# Patient Record
Sex: Female | Born: 1960 | Race: Black or African American | Hispanic: No | Marital: Single | Smoking: Current every day smoker
Health system: Southern US, Community
[De-identification: ages and names within clinical notes are randomized; demographics above are authoritative.]

## PROBLEM LIST (undated history)

## (undated) DIAGNOSIS — I10 Essential (primary) hypertension: Secondary | ICD-10-CM

## (undated) DIAGNOSIS — K861 Other chronic pancreatitis: Secondary | ICD-10-CM

## (undated) DIAGNOSIS — D62 Acute posthemorrhagic anemia: Secondary | ICD-10-CM

## (undated) DIAGNOSIS — F1010 Alcohol abuse, uncomplicated: Secondary | ICD-10-CM

## (undated) DIAGNOSIS — J449 Chronic obstructive pulmonary disease, unspecified: Secondary | ICD-10-CM

## (undated) DIAGNOSIS — K469 Unspecified abdominal hernia without obstruction or gangrene: Secondary | ICD-10-CM

## (undated) DIAGNOSIS — D689 Coagulation defect, unspecified: Secondary | ICD-10-CM

## (undated) DIAGNOSIS — K7031 Alcoholic cirrhosis of liver with ascites: Secondary | ICD-10-CM

## (undated) DIAGNOSIS — D638 Anemia in other chronic diseases classified elsewhere: Secondary | ICD-10-CM

## (undated) DIAGNOSIS — D696 Thrombocytopenia, unspecified: Secondary | ICD-10-CM

## (undated) DIAGNOSIS — I509 Heart failure, unspecified: Secondary | ICD-10-CM

## (undated) DIAGNOSIS — K922 Gastrointestinal hemorrhage, unspecified: Secondary | ICD-10-CM

## (undated) DIAGNOSIS — Z720 Tobacco use: Secondary | ICD-10-CM

## (undated) HISTORY — PX: PARACENTESIS: SHX844

---

## 2013-10-30 ENCOUNTER — Inpatient Hospital Stay (HOSPITAL_BASED_OUTPATIENT_CLINIC_OR_DEPARTMENT_OTHER): Payer: Medicaid Other

## 2013-10-30 ENCOUNTER — Emergency Department (HOSPITAL_BASED_OUTPATIENT_CLINIC_OR_DEPARTMENT_OTHER): Payer: Medicaid Other

## 2013-10-30 ENCOUNTER — Inpatient Hospital Stay (HOSPITAL_BASED_OUTPATIENT_CLINIC_OR_DEPARTMENT_OTHER)
Admission: EM | Admit: 2013-10-30 | Discharge: 2013-11-02 | DRG: 189 | Disposition: A | Discharge status: Home Health | Payer: Medicaid Other | Attending: Internal Medicine | Admitting: Internal Medicine

## 2013-10-30 ENCOUNTER — Encounter (HOSPITAL_BASED_OUTPATIENT_CLINIC_OR_DEPARTMENT_OTHER): Payer: Self-pay | Admitting: Emergency Medicine

## 2013-10-30 DIAGNOSIS — I10 Essential (primary) hypertension: Secondary | ICD-10-CM | POA: Diagnosis present

## 2013-10-30 DIAGNOSIS — Z833 Family history of diabetes mellitus: Secondary | ICD-10-CM | POA: Diagnosis not present

## 2013-10-30 DIAGNOSIS — I509 Heart failure, unspecified: Secondary | ICD-10-CM | POA: Diagnosis present

## 2013-10-30 DIAGNOSIS — J9621 Acute and chronic respiratory failure with hypoxia: Secondary | ICD-10-CM | POA: Diagnosis present

## 2013-10-30 DIAGNOSIS — J441 Chronic obstructive pulmonary disease with (acute) exacerbation: Secondary | ICD-10-CM | POA: Diagnosis present

## 2013-10-30 DIAGNOSIS — D649 Anemia, unspecified: Secondary | ICD-10-CM | POA: Diagnosis present

## 2013-10-30 DIAGNOSIS — D6959 Other secondary thrombocytopenia: Secondary | ICD-10-CM | POA: Diagnosis present

## 2013-10-30 DIAGNOSIS — F329 Major depressive disorder, single episode, unspecified: Secondary | ICD-10-CM | POA: Diagnosis present

## 2013-10-30 DIAGNOSIS — F3289 Other specified depressive episodes: Secondary | ICD-10-CM | POA: Diagnosis present

## 2013-10-30 DIAGNOSIS — R062 Wheezing: Secondary | ICD-10-CM | POA: Diagnosis present

## 2013-10-30 DIAGNOSIS — Z720 Tobacco use: Secondary | ICD-10-CM

## 2013-10-30 DIAGNOSIS — I5032 Chronic diastolic (congestive) heart failure: Secondary | ICD-10-CM | POA: Diagnosis present

## 2013-10-30 DIAGNOSIS — F17200 Nicotine dependence, unspecified, uncomplicated: Secondary | ICD-10-CM | POA: Diagnosis present

## 2013-10-30 DIAGNOSIS — T380X5A Adverse effect of glucocorticoids and synthetic analogues, initial encounter: Secondary | ICD-10-CM | POA: Diagnosis present

## 2013-10-30 DIAGNOSIS — D696 Thrombocytopenia, unspecified: Secondary | ICD-10-CM

## 2013-10-30 DIAGNOSIS — R188 Other ascites: Secondary | ICD-10-CM | POA: Diagnosis present

## 2013-10-30 DIAGNOSIS — J9620 Acute and chronic respiratory failure, unspecified whether with hypoxia or hypercapnia: Principal | ICD-10-CM | POA: Diagnosis present

## 2013-10-30 DIAGNOSIS — R0602 Shortness of breath: Secondary | ICD-10-CM | POA: Diagnosis present

## 2013-10-30 DIAGNOSIS — K7030 Alcoholic cirrhosis of liver without ascites: Secondary | ICD-10-CM | POA: Diagnosis present

## 2013-10-30 DIAGNOSIS — R7309 Other abnormal glucose: Secondary | ICD-10-CM | POA: Diagnosis present

## 2013-10-30 DIAGNOSIS — K7460 Unspecified cirrhosis of liver: Secondary | ICD-10-CM | POA: Diagnosis present

## 2013-10-30 DIAGNOSIS — F1010 Alcohol abuse, uncomplicated: Secondary | ICD-10-CM | POA: Diagnosis present

## 2013-10-30 HISTORY — DX: Chronic obstructive pulmonary disease, unspecified: J44.9

## 2013-10-30 HISTORY — DX: Heart failure, unspecified: I50.9

## 2013-10-30 HISTORY — DX: Essential (primary) hypertension: I10

## 2013-10-30 HISTORY — DX: Tobacco use: Z72.0

## 2013-10-30 HISTORY — DX: Alcohol abuse, uncomplicated: F10.10

## 2013-10-30 LAB — CBC WITH DIFFERENTIAL/PLATELET
BASOS PCT: 0 % (ref 0–1)
Basophils Absolute: 0 10*3/uL (ref 0.0–0.1)
EOS PCT: 0 % (ref 0–5)
Eosinophils Absolute: 0 10*3/uL (ref 0.0–0.7)
HCT: 29.7 % — ABNORMAL LOW (ref 36.0–46.0)
HEMOGLOBIN: 10.3 g/dL — AB (ref 12.0–15.0)
LYMPHS ABS: 0.9 10*3/uL (ref 0.7–4.0)
Lymphocytes Relative: 5 % — ABNORMAL LOW (ref 12–46)
MCH: 29.7 pg (ref 26.0–34.0)
MCHC: 34.7 g/dL (ref 30.0–36.0)
MCV: 85.6 fL (ref 78.0–100.0)
MONOS PCT: 5 % (ref 3–12)
Monocytes Absolute: 1 10*3/uL (ref 0.1–1.0)
NEUTROS PCT: 90 % — AB (ref 43–77)
Neutro Abs: 17.8 10*3/uL — ABNORMAL HIGH (ref 1.7–7.7)
PLATELETS: 87 10*3/uL — AB (ref 150–400)
RBC: 3.47 MIL/uL — AB (ref 3.87–5.11)
RDW: 15.5 % (ref 11.5–15.5)
WBC: 19.7 10*3/uL — ABNORMAL HIGH (ref 4.0–10.5)

## 2013-10-30 LAB — COMPREHENSIVE METABOLIC PANEL
ALBUMIN: 3.3 g/dL — AB (ref 3.5–5.2)
ALT: 18 U/L (ref 0–35)
ANION GAP: 13 (ref 5–15)
AST: 28 U/L (ref 0–37)
Alkaline Phosphatase: 219 U/L — ABNORMAL HIGH (ref 39–117)
BUN: 10 mg/dL (ref 6–23)
CALCIUM: 9.1 mg/dL (ref 8.4–10.5)
CO2: 32 mEq/L (ref 19–32)
CREATININE: 0.8 mg/dL (ref 0.50–1.10)
Chloride: 94 mEq/L — ABNORMAL LOW (ref 96–112)
GFR calc non Af Amer: 83 mL/min — ABNORMAL LOW (ref >90)
GLUCOSE: 228 mg/dL — AB (ref 70–99)
Potassium: 4.7 mEq/L (ref 3.7–5.3)
Sodium: 139 mEq/L (ref 137–147)
TOTAL PROTEIN: 6.8 g/dL (ref 6.0–8.3)
Total Bilirubin: 0.6 mg/dL (ref 0.3–1.2)

## 2013-10-30 LAB — I-STAT ARTERIAL BLOOD GAS, ED
ACID-BASE EXCESS: 11 mmol/L — AB (ref 0.0–2.0)
Bicarbonate: 37.1 mEq/L — ABNORMAL HIGH (ref 20.0–24.0)
O2 SAT: 98 %
TCO2: 39 mmol/L (ref 0–100)
pCO2 arterial: 56.7 mmHg — ABNORMAL HIGH (ref 35.0–45.0)
pH, Arterial: 7.424 (ref 7.350–7.450)
pO2, Arterial: 99 mmHg (ref 80.0–100.0)

## 2013-10-30 LAB — PRO B NATRIURETIC PEPTIDE: PRO B NATRI PEPTIDE: 630.4 pg/mL — AB (ref 0–125)

## 2013-10-30 LAB — I-STAT CG4 LACTIC ACID, ED: Lactic Acid, Venous: 2.25 mmol/L — ABNORMAL HIGH (ref 0.5–2.2)

## 2013-10-30 LAB — PROTIME-INR
INR: 1.21 (ref 0.00–1.49)
PROTHROMBIN TIME: 15.3 s — AB (ref 11.6–15.2)

## 2013-10-30 LAB — TROPONIN I

## 2013-10-30 MED ORDER — ALBUTEROL SULFATE (2.5 MG/3ML) 0.083% IN NEBU
INHALATION_SOLUTION | RESPIRATORY_TRACT | Status: AC
  Administered 2013-10-30: 5 mg
  Filled 2013-10-30: qty 6

## 2013-10-30 MED ORDER — LEVOFLOXACIN IN D5W 500 MG/100ML IV SOLN
500.0000 mg | Freq: Once | INTRAVENOUS | Status: AC
  Administered 2013-10-30: 500 mg via INTRAVENOUS
  Filled 2013-10-30: qty 100

## 2013-10-30 MED ORDER — IPRATROPIUM BROMIDE 0.02 % IN SOLN
RESPIRATORY_TRACT | Status: AC
  Administered 2013-10-30: 0.5 mg
  Filled 2013-10-30: qty 2.5

## 2013-10-30 MED ORDER — ALBUTEROL (5 MG/ML) CONTINUOUS INHALATION SOLN: 10.0000 mg/h | INHALATION_SOLUTION | Freq: Once | RESPIRATORY_TRACT | Status: DC

## 2013-10-30 MED ORDER — METHYLPREDNISOLONE SODIUM SUCC 125 MG IJ SOLR
125.0000 mg | Freq: Once | INTRAMUSCULAR | Status: AC
  Administered 2013-10-30: 125 mg via INTRAVENOUS
  Filled 2013-10-30: qty 2

## 2013-10-30 MED ORDER — ALBUTEROL SULFATE (2.5 MG/3ML) 0.083% IN NEBU: 2.5000 mg | INHALATION_SOLUTION | RESPIRATORY_TRACT | Status: DC | PRN

## 2013-10-30 NOTE — ED Notes (Signed)
Family contact info: Alphonzo DublinValarie Mitchell sister [phone],  Noreene Larssonnnie Sunday mother [phone].

## 2013-10-30 NOTE — ED Provider Notes (Signed)
CSN: [id number]     Arrival date & time 10/30/13  1856 History  This chart was scribed for Glynn Octave, MD by Modena Jansky, ED Scribe. This patient was seen in room MH09/MH09 and the patient's care was started at 7:14 PM.  Chief Complaint  Patient presents with  . Shortness of Breath   HPI HPI Comments: Marie Brooks is a 53 y.o. female with a hx of COPD and CHF who presents to the Emergency Department complaining of intermittent moderate SOB that started two months ago. She was diagnosed with pneumonia at another hospital in [facility] and left AMA because "they weren't doing anything". She has been admitted to [facility] in May and June as well for COPD and possible PNA. She reports a nonproductive cough and she feels that there is mucous in her lungs. She reports associated abdominal pain. She also reports a subjective fever. She states that she quit smoking 3 days ago. She reports that she uses a nebulizer treatment at home. She denies any hx of blood clots, MI, or stents. She also denies any emesis or chest pain.    Past Medical History  Diagnosis Date  . COPD (chronic obstructive pulmonary disease)   . CHF (congestive heart failure)   . HTN (hypertension)   . Ascites   . Cirrhosis   . Tobacco abuse   . Alcohol abuse    Past Surgical History  Procedure Laterality Date  . Paracentesis N/A     Early July 2015.   Family History  Problem Relation Age of Onset  . Diabetes Brother    History  Substance Use Topics  . Smoking status: Current Every Day Smoker -- 0.25 packs/day for 30 years  . Smokeless tobacco: Not on file  . Alcohol Use: Yes   OB History   Grav Para Term Preterm Abortions TAB SAB Ect Mult Living                 Review of Systems  A complete 10 system review of systems was obtained and all systems are negative except as noted in the HPI and PMH.   Allergies  Review of patient's allergies indicates no known allergies.  Home Medications   Prior to Admission  medications   Medication Sig Start Date End Date Taking? Authorizing Provider  albuterol (PROVENTIL) (2.5 MG/3ML) 0.083% nebulizer solution Take 2.5 mg by nebulization every 6 (six) hours as needed for wheezing or shortness of breath.   Yes Historical Provider, MD  carvedilol (COREG) 3.125 MG tablet Take 3.125 mg by mouth 2 (two) times daily with a meal.   Yes Historical Provider, MD  Fluticasone-Salmeterol (ADVAIR) 250-50 MCG/DOSE AEPB Inhale 1 puff into the lungs 2 (two) times daily.   Yes Historical Provider, MD  furosemide (LASIX) 20 MG tablet Take 20 mg by mouth daily.   Yes Historical Provider, MD  lisinopril (PRINIVIL,ZESTRIL) 10 MG tablet Take 10 mg by mouth daily.   Yes Historical Provider, MD  pantoprazole (PROTONIX) 40 MG tablet Take 40 mg by mouth daily.   Yes Historical Provider, MD  potassium chloride SA (K-DUR,KLOR-CON) 20 MEQ tablet Take 20 mEq by mouth daily.   Yes Historical Provider, MD  spironolactone (ALDACTONE) 100 MG tablet Take 100 mg by mouth daily.   Yes Historical Provider, MD  tiotropium (SPIRIVA) 18 MCG inhalation capsule Place 18 mcg into inhaler and inhale daily.   Yes Historical Provider, MD   BP 148/85  Pulse 108  Temp(Src) 98.7 F (37.1 C) (Oral)  Resp 24  Ht 5\' 7"  (1.702 m)  Wt 153 lb 11.2 oz (69.718 kg)  BMI 24.07 kg/m2  SpO2 93% Physical Exam  Nursing note and vitals reviewed. Constitutional: She is oriented to person, place, and time. She appears well-developed and well-nourished. She appears distressed.  HENT:  Head: Normocephalic and atraumatic.  Mouth/Throat: Oropharynx is clear and moist.  Eyes: Conjunctivae and EOM are normal. Pupils are equal, round, and reactive to light.  Neck: Normal range of motion. Neck supple.  Cardiovascular: Normal rate, regular rhythm and normal heart sounds.   No murmur heard. Pulmonary/Chest: She is in respiratory distress. She has wheezes.  Moderate respiratory distress. Speaking in short sentences. Decreased  breath sounds throughout with scattered expiratory wheezing. Poor air movement  Abdominal: Soft. There is no tenderness. There is no rebound and no guarding.  Questionable ascites on abdomen, bandage to recent paracentesis site.  Musculoskeletal: Normal range of motion. She exhibits edema. She exhibits no tenderness.  Trace lower extremity edema.  Neurological: She is alert and oriented to person, place, and time. No cranial nerve deficit. She exhibits normal muscle tone. Coordination normal.  Skin: Skin is warm. No rash noted.    ED Course  Procedures (including critical care time) DIAGNOSTIC STUDIES: Oxygen Saturation is 93% on oxygen, normal by my interpretation.    COORDINATION OF CARE: 7:18 PM- Pt advised of plan for treatment which includes medication, radiology, and labsand pt agrees.  Labs Review Labs Reviewed  CBC WITH DIFFERENTIAL - Abnormal; Notable for the following:    WBC 19.7 (*)    RBC 3.47 (*)    Hemoglobin 10.3 (*)    HCT 29.7 (*)    Platelets 87 (*)    Neutrophils Relative % 90 (*)    Neutro Abs 17.8 (*)    Lymphocytes Relative 5 (*)    All other components within normal limits  COMPREHENSIVE METABOLIC PANEL - Abnormal; Notable for the following:    Chloride 94 (*)    Glucose, Bld 228 (*)    Albumin 3.3 (*)    Alkaline Phosphatase 219 (*)    GFR calc non Af Amer 83 (*)    All other components within normal limits  PRO B NATRIURETIC PEPTIDE - Abnormal; Notable for the following:    Pro B Natriuretic peptide (BNP) 630.4 (*)    All other components within normal limits  PROTIME-INR - Abnormal; Notable for the following:    Prothrombin Time 15.3 (*)    All other components within normal limits  I-STAT ARTERIAL BLOOD GAS, ED - Abnormal; Notable for the following:    pCO2 arterial 56.7 (*)    Bicarbonate 37.1 (*)    Acid-Base Excess 11.0 (*)    All other components within normal limits  I-STAT CG4 LACTIC ACID, ED - Abnormal; Notable for the following:     Lactic Acid, Venous 2.25 (*)    All other components within normal limits  MRSA PCR SCREENING  TROPONIN I  URINALYSIS, ROUTINE W REFLEX MICROSCOPIC  BASIC METABOLIC PANEL  CBC  HEMOGLOBIN A1C  TSH    Imaging Review Dg Chest Portable 1 View  10/30/2013   CLINICAL DATA:  Shortness of breath.  History of CHF.  EXAM: PORTABLE CHEST - 1 VIEW  COMPARISON:  CT chest 10/29/2013 and chest radiograph 10/29/2013  FINDINGS: Heart mediastinal, and hilar contours are stable. There are emphysematous changes of the lungs. Slight blunting of the right costophrenic angle may reflect a small right pleural effusion. No airspace  disease is identified.  IMPRESSION: 1.  Emphysema/COPD.  2.  Cannot exclude small right pleural effusion.   Electronically Signed   By: Britta Mccreedy M.D.   On: 10/30/2013 20:01     EKG Interpretation   Date/Time:  Sunday October 30 2013 19:18:41 EDT Ventricular Rate:  106 PR Interval:  122 QRS Duration: 74 QT Interval:  338 QTC Calculation: 448 R Axis:   67 Text Interpretation:  Sinus tachycardia Low voltage QRS Borderline ECG No  previous ECGs available Confirmed by Manus Gunning  MD, Gomer France [phone]) on  10/30/2013 7:35:14 PM      MDM   Final diagnoses:  Acute on chronic respiratory failure with hypoxia   Patient left AMA from [facility] earlier today after being admitted for COPD and pneumonia. She is in respiratory distress with decreased air movement throughout. She denies any chest pain. Hypoxia to 90 on room air.  CT report from July 11 shows clear lungs with small right-sided effusion and anasarca of the soft tissues. Chest x-ray from July 11 shows COPD changes with bibasilar airspace opacities.  Patient appears to have COPD exacerbation possible diastolic heart failure exacerbation. No CO2 retention ABG. Chest x-ray is clear. Patient given nebulizers, steroids, IV Levaquin.  Ultrasound shows small pericardial effusion. No evidence of tamponade or R  heart strain  Records requested from [facility]. Patient is stable nasal cannula and does not require BiPAP at this time. She'll need admission to the hospital. No step down beds available at [facility]. Discussed with Dr. Betti Cruz. Dr. Waymon Amato accepts patient to [facility] long stepdown. CTPE study was ordered but not completed prior to transfer. [facility] from [facility] not received prior to transfer.  CRITICAL CARE Performed by: Glynn Octave Total critical care time: 30 Critical care time was exclusive of separately billable procedures and treating other patients. Critical care was necessary to treat or prevent imminent or life-threatening deterioration. Critical care was time spent personally by me on the following activities: development of treatment plan with patient and/or surrogate as well as nursing, discussions with consultants, evaluation of patient's response to treatment, examination of patient, obtaining history from patient or surrogate, ordering and performing treatments and interventions, ordering and review of laboratory studies, ordering and review of radiographic studies, pulse oximetry and re-evaluation of patient's condition.     EMERGENCY DEPARTMENT [place] CARDIAC EXAM "Study: Limited Ultrasound of the heart and pericardium"  INDICATIONS:Dyspnea Multiple views of the heart and pericardium are obtained with a multi-frequency probe.  PERFORMED UE:AVWUJW  IMAGES ARCHIVED?: Yes  FINDINGS: Small effusion, Normal contractility and Tamponade physiology absent  LIMITATIONS:  Body habitus and Emergent procedure  VIEWS USED: Subcostal 4 chamber and Parasternal long axis  INTERPRETATION: Cardiac activity present, Pericardial effusion present, Cardiac tamponade absent and Probable elevated CVP  COMMENT:    BP 127/87  Pulse 88  Temp(Src) 98.4 F (36.9 C) (Oral)  Resp 18  Ht 5\' 7"  (1.702 m)  Wt 155 lb 6.8 oz (70.5 kg)  BMI 24.34 kg/m2  SpO2 94%   I personally  performed the services described in this documentation, which was scribed in my presence. The recorded information has been reviewed and is accurate.     Glynn Octave, MD 10/31/13 0157

## 2013-10-30 NOTE — ED Notes (Signed)
Pt reports was dx with pneumonia today.  Shortness of breath.  Speaking in short sentences with some dyspnea.

## 2013-10-31 ENCOUNTER — Inpatient Hospital Stay (HOSPITAL_COMMUNITY): Payer: Medicaid Other

## 2013-10-31 ENCOUNTER — Encounter (HOSPITAL_COMMUNITY): Payer: Self-pay | Admitting: Internal Medicine

## 2013-10-31 DIAGNOSIS — J9620 Acute and chronic respiratory failure, unspecified whether with hypoxia or hypercapnia: Principal | ICD-10-CM

## 2013-10-31 DIAGNOSIS — R188 Other ascites: Secondary | ICD-10-CM | POA: Diagnosis present

## 2013-10-31 DIAGNOSIS — F17200 Nicotine dependence, unspecified, uncomplicated: Secondary | ICD-10-CM

## 2013-10-31 DIAGNOSIS — D696 Thrombocytopenia, unspecified: Secondary | ICD-10-CM

## 2013-10-31 DIAGNOSIS — J441 Chronic obstructive pulmonary disease with (acute) exacerbation: Secondary | ICD-10-CM

## 2013-10-31 DIAGNOSIS — R0902 Hypoxemia: Secondary | ICD-10-CM

## 2013-10-31 DIAGNOSIS — I517 Cardiomegaly: Secondary | ICD-10-CM

## 2013-10-31 DIAGNOSIS — F1010 Alcohol abuse, uncomplicated: Secondary | ICD-10-CM | POA: Diagnosis present

## 2013-10-31 DIAGNOSIS — Z720 Tobacco use: Secondary | ICD-10-CM | POA: Diagnosis present

## 2013-10-31 DIAGNOSIS — K7460 Unspecified cirrhosis of liver: Secondary | ICD-10-CM | POA: Diagnosis present

## 2013-10-31 DIAGNOSIS — J9621 Acute and chronic respiratory failure with hypoxia: Secondary | ICD-10-CM | POA: Diagnosis present

## 2013-10-31 DIAGNOSIS — I10 Essential (primary) hypertension: Secondary | ICD-10-CM | POA: Diagnosis present

## 2013-10-31 HISTORY — DX: Thrombocytopenia, unspecified: D69.6

## 2013-10-31 LAB — GLUCOSE, CAPILLARY
GLUCOSE-CAPILLARY: 226 mg/dL — AB (ref 70–99)
Glucose-Capillary: 188 mg/dL — ABNORMAL HIGH (ref 70–99)
Glucose-Capillary: 149 mg/dL — ABNORMAL HIGH (ref 70–99)

## 2013-10-31 LAB — CBC
HEMATOCRIT: 25.7 % — AB (ref 36.0–46.0)
HEMOGLOBIN: 8.8 g/dL — AB (ref 12.0–15.0)
MCH: 29.8 pg (ref 26.0–34.0)
MCHC: 34.2 g/dL (ref 30.0–36.0)
MCV: 87.1 fL (ref 78.0–100.0)
Platelets: 77 10*3/uL — ABNORMAL LOW (ref 150–400)
RBC: 2.95 MIL/uL — ABNORMAL LOW (ref 3.87–5.11)
RDW: 15.2 % (ref 11.5–15.5)
WBC: 15.6 10*3/uL — AB (ref 4.0–10.5)

## 2013-10-31 LAB — HEMOGLOBIN A1C
Hgb A1c MFr Bld: 6.2 % — ABNORMAL HIGH (ref <5.7)
Mean Plasma Glucose: 131 mg/dL — ABNORMAL HIGH (ref <117)

## 2013-10-31 LAB — BASIC METABOLIC PANEL
ANION GAP: 10 (ref 5–15)
BUN: 12 mg/dL (ref 6–23)
CALCIUM: 8.6 mg/dL (ref 8.4–10.5)
CHLORIDE: 94 meq/L — AB (ref 96–112)
CO2: 33 meq/L — AB (ref 19–32)
Creatinine, Ser: 0.78 mg/dL (ref 0.50–1.10)
GFR calc Af Amer: >90 mL/min (ref >90)
GFR calc non Af Amer: >90 mL/min (ref >90)
Glucose, Bld: 244 mg/dL — ABNORMAL HIGH (ref 70–99)
Potassium: 4.2 mEq/L (ref 3.7–5.3)
SODIUM: 137 meq/L (ref 137–147)

## 2013-10-31 LAB — MRSA PCR SCREENING: MRSA by PCR: NEGATIVE

## 2013-10-31 LAB — URINALYSIS, ROUTINE W REFLEX MICROSCOPIC
BILIRUBIN URINE: NEGATIVE
Glucose, UA: NEGATIVE mg/dL
HGB URINE DIPSTICK: NEGATIVE
KETONES UR: NEGATIVE mg/dL
Leukocytes, UA: NEGATIVE
NITRITE: NEGATIVE
Protein, ur: NEGATIVE mg/dL
Specific Gravity, Urine: 1.01 (ref 1.005–1.030)
UROBILINOGEN UA: 0.2 mg/dL (ref 0.0–1.0)
pH: 6 (ref 5.0–8.0)

## 2013-10-31 LAB — D-DIMER, QUANTITATIVE: D-Dimer, Quant: 3.77 ug/mL-FEU — ABNORMAL HIGH (ref 0.00–0.48)

## 2013-10-31 LAB — TSH: TSH: 0.331 u[IU]/mL — AB (ref 0.350–4.500)

## 2013-10-31 MED ORDER — SPIRONOLACTONE 50 MG PO TABS
50.0000 mg | ORAL_TABLET | Freq: Two times a day (BID) | ORAL | Status: DC
  Administered 2013-10-31 – 2013-11-02 (×4): 50 mg via ORAL
  Filled 2013-10-31 (×2): qty 1
  Filled 2013-10-31: qty 2
  Filled 2013-10-31: qty 1
  Filled 2013-10-31: qty 2
  Filled 2013-10-31: qty 1

## 2013-10-31 MED ORDER — ONDANSETRON 4 MG PO TBDP
4.0000 mg | ORAL_TABLET | Freq: Four times a day (QID) | ORAL | Status: DC | PRN
  Filled 2013-10-31: qty 1

## 2013-10-31 MED ORDER — SODIUM CHLORIDE 0.9 % IJ SOLN
3.0000 mL | Freq: Two times a day (BID) | INTRAMUSCULAR | Status: DC
  Administered 2013-10-31 – 2013-11-01 (×3): 3 mL via INTRAVENOUS

## 2013-10-31 MED ORDER — ENOXAPARIN SODIUM 40 MG/0.4ML ~~LOC~~ SOLN
40.0000 mg | SUBCUTANEOUS | Status: DC
  Administered 2013-10-31 – 2013-11-01 (×2): 40 mg via SUBCUTANEOUS
  Filled 2013-10-31 (×3): qty 0.4

## 2013-10-31 MED ORDER — ACETAMINOPHEN 650 MG RE SUPP
650.0000 mg | Freq: Four times a day (QID) | RECTAL | Status: DC | PRN
Start: 2013-10-31 — End: 2013-11-02

## 2013-10-31 MED ORDER — VITAMIN B-1 100 MG PO TABS
100.0000 mg | ORAL_TABLET | Freq: Every day | ORAL | Status: DC
  Administered 2013-11-01 – 2013-11-02 (×2): 100 mg via ORAL
  Filled 2013-10-31 (×2): qty 1

## 2013-10-31 MED ORDER — TECHNETIUM TO 99M ALBUMIN AGGREGATED
5.5000 | Freq: Once | INTRAVENOUS | Status: AC | PRN
  Administered 2013-10-31: 5.5 via INTRAVENOUS

## 2013-10-31 MED ORDER — HYDROXYZINE HCL 25 MG PO TABS
25.0000 mg | ORAL_TABLET | Freq: Four times a day (QID) | ORAL | Status: DC | PRN
  Filled 2013-10-31: qty 1

## 2013-10-31 MED ORDER — INSULIN ASPART 100 UNIT/ML ~~LOC~~ SOLN
0.0000 [IU] | Freq: Three times a day (TID) | SUBCUTANEOUS | Status: DC
  Administered 2013-10-31: 1 [IU] via SUBCUTANEOUS
  Administered 2013-10-31: 2 [IU] via SUBCUTANEOUS
  Administered 2013-10-31: 3 [IU] via SUBCUTANEOUS
  Administered 2013-11-01: 5 [IU] via SUBCUTANEOUS
  Administered 2013-11-01 (×2): 3 [IU] via SUBCUTANEOUS
  Administered 2013-11-02: 9 [IU] via SUBCUTANEOUS
  Administered 2013-11-02: 2 [IU] via SUBCUTANEOUS

## 2013-10-31 MED ORDER — ALBUTEROL SULFATE (2.5 MG/3ML) 0.083% IN NEBU: 2.5000 mg | INHALATION_SOLUTION | Freq: Four times a day (QID) | RESPIRATORY_TRACT | Status: DC

## 2013-10-31 MED ORDER — THIAMINE HCL 100 MG/ML IJ SOLN
100.0000 mg | Freq: Once | INTRAMUSCULAR | Status: AC
  Administered 2013-10-31: 100 mg via INTRAMUSCULAR
  Filled 2013-10-31: qty 2

## 2013-10-31 MED ORDER — LOPERAMIDE HCL 2 MG PO CAPS: 2.0000 mg | ORAL_CAPSULE | ORAL | Status: DC | PRN

## 2013-10-31 MED ORDER — ALBUTEROL SULFATE (2.5 MG/3ML) 0.083% IN NEBU: 2.5000 mg | INHALATION_SOLUTION | RESPIRATORY_TRACT | Status: DC | PRN

## 2013-10-31 MED ORDER — ADULT MULTIVITAMIN W/MINERALS CH
1.0000 | ORAL_TABLET | Freq: Every day | ORAL | Status: DC
  Administered 2013-10-31 – 2013-11-02 (×3): 1 via ORAL
  Filled 2013-10-31 (×3): qty 1

## 2013-10-31 MED ORDER — FUROSEMIDE 10 MG/ML IJ SOLN
20.0000 mg | Freq: Once | INTRAMUSCULAR | Status: AC
  Administered 2013-10-31: 20 mg via INTRAVENOUS
  Filled 2013-10-31: qty 2

## 2013-10-31 MED ORDER — LORAZEPAM 1 MG PO TABS
1.0000 mg | ORAL_TABLET | Freq: Four times a day (QID) | ORAL | Status: DC | PRN
Start: 2013-10-31 — End: 2013-11-02
  Administered 2013-10-31 (×2): 1 mg via ORAL
  Filled 2013-10-31 (×2): qty 1

## 2013-10-31 MED ORDER — GUAIFENESIN ER 600 MG PO TB12
600.0000 mg | ORAL_TABLET | Freq: Two times a day (BID) | ORAL | Status: DC
  Administered 2013-10-31 – 2013-11-02 (×6): 600 mg via ORAL
  Filled 2013-10-31 (×7): qty 1

## 2013-10-31 MED ORDER — IPRATROPIUM BROMIDE 0.02 % IN SOLN: 0.5000 mg | Freq: Four times a day (QID) | RESPIRATORY_TRACT | Status: DC

## 2013-10-31 MED ORDER — ACETAMINOPHEN 325 MG PO TABS
650.0000 mg | ORAL_TABLET | Freq: Four times a day (QID) | ORAL | Status: DC | PRN
  Administered 2013-11-01: 650 mg via ORAL
  Filled 2013-10-31: qty 2

## 2013-10-31 MED ORDER — LEVOFLOXACIN IN D5W 500 MG/100ML IV SOLN
500.0000 mg | INTRAVENOUS | Status: DC
  Administered 2013-10-31 – 2013-11-01 (×2): 500 mg via INTRAVENOUS
  Filled 2013-10-31 (×3): qty 100

## 2013-10-31 MED ORDER — METHYLPREDNISOLONE SODIUM SUCC 125 MG IJ SOLR
60.0000 mg | Freq: Three times a day (TID) | INTRAMUSCULAR | Status: DC
  Administered 2013-10-31 – 2013-11-02 (×7): 60 mg via INTRAVENOUS
  Filled 2013-10-31 (×2): qty 0.96
  Filled 2013-10-31: qty 2
  Filled 2013-10-31: qty 0.96
  Filled 2013-10-31: qty 2
  Filled 2013-10-31: qty 0.96
  Filled 2013-10-31: qty 2
  Filled 2013-10-31 (×2): qty 0.96
  Filled 2013-10-31: qty 2

## 2013-10-31 MED ORDER — ALUM & MAG HYDROXIDE-SIMETH 200-200-20 MG/5ML PO SUSP
30.0000 mL | Freq: Four times a day (QID) | ORAL | Status: DC | PRN
Start: 2013-10-31 — End: 2013-11-02

## 2013-10-31 MED ORDER — TECHNETIUM TC 99M DIETHYLENETRIAME-PENTAACETIC ACID: 33.0000 | Freq: Once | INTRAVENOUS | Status: AC | PRN

## 2013-10-31 MED ORDER — IPRATROPIUM-ALBUTEROL 0.5-2.5 (3) MG/3ML IN SOLN
3.0000 mL | Freq: Four times a day (QID) | RESPIRATORY_TRACT | Status: DC
  Administered 2013-10-31 – 2013-11-01 (×8): 3 mL via RESPIRATORY_TRACT
  Filled 2013-10-31 (×7): qty 3

## 2013-10-31 MED ORDER — MOMETASONE FURO-FORMOTEROL FUM 100-5 MCG/ACT IN AERO
2.0000 | INHALATION_SPRAY | Freq: Two times a day (BID) | RESPIRATORY_TRACT | Status: DC
  Administered 2013-11-01 – 2013-11-02 (×3): 2 via RESPIRATORY_TRACT
  Filled 2013-10-31 (×2): qty 8.8

## 2013-10-31 MED ORDER — LEVOFLOXACIN 500 MG PO TABS: 500.0000 mg | ORAL_TABLET | Freq: Every evening | ORAL | Status: DC

## 2013-10-31 MED ORDER — GUAIFENESIN-DM 100-10 MG/5ML PO SYRP: 5.0000 mL | ORAL_SOLUTION | ORAL | Status: DC | PRN

## 2013-10-31 NOTE — Progress Notes (Addendum)
[facility] PROGRESS NOTE  Marie Brooks ZOX:[id number] DOB: 11/17/1960 DOA: 10/30/2013 PCP: No primary provider on file.  Assessment/Plan: Principal Problem:   COPD exacerbation Active Problems:   HTN (hypertension)   Ascites   Cirrhosis   Tobacco abuse   Alcohol abuse   Acute on chronic respiratory failure with hypoxia   Thrombocytopenia, unspecified   1. COPD exacerbation: Admitted to step down unit. Treat with oxygen, bronchodilator nebulizations, switched levofloxacin to IV , continue IV Solu-Medrol. , Rule out pulmonary embolism, CT without contrast on 7/11 at [facility] did not show any acute pneumonia just a small pleural effusion on the right. Interval resolution of airspace disease seen in the right upper lobe. Check a d-dimer if elevated will order a VQ scan to rule out pulmonary embolism. Avoid contrast study given history of cirrhosis and increased the renal failure. Diurese as needed. 2. Acute on chronic hypoxic respiratory failure: Precipitated by COPD exacerbation. Continue oxygen and reassess her oxygen needs prior to discharge. Although BNP is mildly elevated, no clinical features suggestive of CHF. 3. Tobacco abuse: Cessation counseled. Patient declines nicotine patch. 4. Possible alcohol abuse: Placed on Ativan protocol. 5. Ascites/? Cirrhosis: Will request medical records from [facility]. Abdominal ultrasound shows prominent gallbladder wall thickening. No gallstones. No significant ascites. Resume Aldactone 6. Thrombocytopenia: May be secondary to cirrhosis and alcohol abuse. Monitor CBCs. 7. Normocytic anemia: No prior labs to compare. Follow CBCs. May be of chronic disease. 8. Hypertension: Controlled. 9. ? Chronic diastolic CHF: Clinically does not appear in CHF. Chest pain is atypical and appears musculoskeletal from coughing. Troponin x1 negative. Check 2-D echo for LV function and to assess mild pericardial effusion seen on bedside  ultrasound by EDP. 10. Hyperglycemia: Rule out diabetes. Check hemoglobin A1c and place on SSI. 11. Leukocytosis:? Secondary to steroids, she may have received that prior hospitalization. Follow CBCs 12.    Code Status: full Family Communication: family updated about patient's clinical progress Disposition Plan: If the patient is stable today we'll transfer to telemetry tomorrow   Brief narrative: 53 y.o. female with history of COPD diagnosed in 2012, on when necessary home oxygen, ongoing tobacco abuse, hypertension, possible alcohol abuse, hospitalized twice at [facility] in May and June for pneumonia, hospitalized in early July at [facility] for ascites and underwent paracentesis-told to have cirrhosis, initially informed to have CHF but then rescinded, readmitted to [facility] a day after discharge for complaints of worsening dyspnea, and then signed out AMA on 10/30/13 and presented to med [facility] with complaints of dyspnea. She gives a three-day history of worsening dyspnea, wheezing, cough which is nonproductive, subjective fevers and substernal chest pain on coughing or deep breathing which is not radiating. She was admitted to [facility]. She was told to have a COPD flareup and? Pneumonia. As per EDP at [facility], CT chest without contrast on July 12 showed clear lungs with small right-sided effusion and anasarca of the soft tissues, chest x-ray from July 11 showed COPD changes with by basilar airspace opacities. She states that she left the hospital AMA because she was dissatisfied with the care. At [facility], she was found to be in respiratory distress which improved after treatment for COPD, chest ultrasound showed small pericardial effusion without tamponade features and lab work showed glucose 228, WBC 19.7, hemoglobin 10.3, platelets 87 and chest x-ray showed emphysema/COPD and possible  small  right sided pleural effusion. She was transferred to [facility] long step down unit for management.   Consultants: None  Procedures: None  Antibiotics: *Levofloxacin  HPI/Subjective: Complaining of shortness of breath, no chest pain  Objective: Filed Vitals:   10/31/13 0800 10/31/13 0900 10/31/13 0908 10/31/13 1200  BP: 163/84   152/81  Pulse: 76 86  81  Temp: 98.6 F (37 C)     TempSrc: Oral     Resp: 18 15  18   Height:      Weight:      SpO2: 100% 96% 96% 100%    Intake/Output Summary (Last 24 hours) at 10/31/13 1338 Last data filed at 10/31/13 1300  Gross per 24 hour  Intake    220 ml  Output    450 ml  Net   -230 ml    Exam:  HENT:  Head: Atraumatic.  Nose: Nose normal.  Mouth/Throat: Oropharynx is clear and moist.  Eyes: Conjunctivae are normal. Pupils are equal, round, and reactive to light. No scleral icterus.  Neck: Neck supple. No tracheal deviation present.  Cardiovascular: Normal rate, regular rhythm, normal heart sounds and intact distal pulses.  Pulmonary/Chest: Effort normal and breath sounds normal. No respiratory distress.  Abdominal: Soft. Normal appearance and bowel sounds are normal. She exhibits no distension. There is no tenderness.  Musculoskeletal: She exhibits no edema and no tenderness.  Neurological: She is alert. No cranial nerve deficit.    Data Reviewed: Basic Metabolic Panel:  Recent Labs Lab 10/30/13 1935 10/31/13 0403  NA 139 137  K 4.7 4.2  CL 94* 94*  CO2 32 33*  GLUCOSE 228* 244*  BUN 10 12  CREATININE 0.80 0.78  CALCIUM 9.1 8.6    Liver Function Tests:  Recent Labs Lab 10/30/13 1935  AST 28  ALT 18  ALKPHOS 219*  BILITOT 0.6  PROT 6.8  ALBUMIN 3.3*   No results found for this basename: LIPASE, AMYLASE,  in the last 168 hours No results found for this basename: AMMONIA,  in the last 168 hours  CBC:  Recent Labs Lab 10/30/13 1935 10/31/13 0403  WBC 19.7* 15.6*  NEUTROABS 17.8*  --   HGB  10.3* 8.8*  HCT 29.7* 25.7*  MCV 85.6 87.1  PLT 87* 77*    Cardiac Enzymes:  Recent Labs Lab 10/30/13 1935  TROPONINI <0.30   BNP (last 3 results)  Recent Labs  10/30/13 1935  PROBNP 630.4*     CBG:  Recent Labs Lab 10/31/13 0748  GLUCAP 188*    Recent Results (from the past 240 hour(s))  MRSA PCR SCREENING     Status: None   Collection Time    10/30/13 11:45 PM      Result Value Ref Range Status   MRSA by PCR NEGATIVE  NEGATIVE Final   Comment:            The GeneXpert MRSA Assay (FDA     approved for NASAL specimens     only), is one component of a     comprehensive MRSA colonization     surveillance program. It is not     intended to diagnose MRSA     infection nor to guide or     monitor treatment for     MRSA infections.     Studies: [place] Chest  10/31/2013   CLINICAL DATA:  Right effusion  EXAM: CHEST ULTRASOUND  COMPARISON:  None.  FINDINGS: Preliminary ultrasound evaluation of the right hemithorax was performed  as a prelude to possible thoracentesis. A small effusion is noted although no safe window to allow for thoracentesis is noted.  IMPRESSION: Small effusion although no thoracentesis was performed.   Electronically Signed   By: Alcide CleverMark  Lukens M.D.   On: 10/31/2013 11:27   [place] Abdomen Complete  10/31/2013   CLINICAL DATA:  Cirrhosis.  EXAM: ULTRASOUND ABDOMEN COMPLETE  COMPARISON:  None.  FINDINGS: Gallbladder:  No gallstones noted. The gallbladder wall is thickened and measures 8 mm. This could be secondary to hypoproteinemia, particularly given the patient's history of cirrhosis and ascites. Acalculous cholecystitis cannot be excluded. No gallbladder distention. No pericholecystic fluid collection. Negative Murphy sign.  Common bile duct:  Diameter: 4 mm  Liver:  Liver parenchyma is coarse and nodular suggesting cirrhosis. Mild intrahepatic biliary ductal distention is present.  IVC:  No abnormality visualized.  Pancreas:  Not visualized.  Spleen:  Size  and appearance within normal limits.  Right Kidney:  Length: 12.5 cm. Echogenicity within normal limits. No mass or hydronephrosis visualized.  Left Kidney:  Length: 12.1 cm. Echogenicity within normal limits. No mass or hydronephrosis visualized.  Abdominal aorta:  No aneurysm visualized.  Other findings:  Ascites.  Small right pleural effusion.  IMPRESSION: 1. Prominent gallbladder wall thickening. This is most likely secondary to hyperproteinemia given the patient's history of cirrhosis and ascites. 2. Small nodular liver consistent with cirrhosis. Mild intrahepatic biliary ductal dilatation is present. No common bile duct distention. No gallstones. 3. Ascites.  Small right pleural effusion.   Electronically Signed   By: Maisie Fushomas  Register   On: 10/31/2013 11:35   Dg Chest Portable 1 View  10/30/2013   CLINICAL DATA:  Shortness of breath.  History of CHF.  EXAM: PORTABLE CHEST - 1 VIEW  COMPARISON:  CT chest 10/29/2013 and chest radiograph 10/29/2013  FINDINGS: Heart mediastinal, and hilar contours are stable. There are emphysematous changes of the lungs. Slight blunting of the right costophrenic angle may reflect a small right pleural effusion. No airspace disease is identified.  IMPRESSION: 1.  Emphysema/COPD.  2.  Cannot exclude small right pleural effusion.   Electronically Signed   By: Britta MccreedySusan  Turner M.D.   On: 10/30/2013 20:01    Scheduled Meds: . enoxaparin (LOVENOX) injection  40 mg Subcutaneous Q24H  . guaiFENesin  600 mg Oral BID  . insulin aspart  0-9 Units Subcutaneous TID WC  . ipratropium-albuterol  3 mL Nebulization Q6H  . [START ON 11/01/2013] levofloxacin  500 mg Oral QPM  . methylPREDNISolone (SOLU-MEDROL) injection  60 mg Intravenous 3 times per day  . multivitamin with minerals  1 tablet Oral Daily  . sodium chloride  3 mL Intravenous Q12H  . [START ON 11/01/2013] thiamine  100 mg Oral Daily   Continuous Infusions:   Principal Problem:   COPD exacerbation Active Problems:    HTN (hypertension)   Ascites   Cirrhosis   Tobacco abuse   Alcohol abuse   Acute on chronic respiratory failure with hypoxia   Thrombocytopenia, unspecified    Time spent: 40 minutes   [facility] Surgery Center LLCBROL,Jamorris Ndiaye  [facility] Pager [phone]. If 8PM-8AM, please contact night-coverage at [email or web address], password [email or web address] 10/31/2013, 1:38 PM  LOS: 1 day

## 2013-10-31 NOTE — Progress Notes (Signed)
RN paged this NP secondary to lab trying multiple times to obtain troponin lab. Reviewed chart, pt has not had chest pain or EKG changes and is here for COPD exacerbation. First troponin was negative. Will cancel order.  Craige CottaKirby, NP

## 2013-10-31 NOTE — Progress Notes (Signed)
CARE MANAGEMENT NOTE 10/31/2013  Patient:  Marie Brooks,Marie Brooks   Account Number:  [id number]  Date Initiated:  10/31/2013  Documentation initiated by:  Latona Krichbaum  Subjective/Objective Assessment:   HX OF COPD WITH ACTIVE TOBACCO USE.  admitted with fever, and elevate wbc poss pna versus plueral effusion.     Action/Plan:   home when stable   Anticipated DC Date:  11/03/2013   Anticipated DC Plan:  HOME/SELF CARE  In-house referral  NA      DC Planning Services  NA      PAC Choice  NA   Choice offered to / List presented to:  NA   DME arranged  NA      DME agency  NA     HH arranged  NA      HH agency  NA   Status of service:  In process, will continue to follow Medicare Important Message given?  NA - LOS <3 / Initial given by admissions (If response is "NO", the following Medicare IM given date fields will be blank) Date Medicare IM given:   Medicare IM given by:   Date Additional Medicare IM given:   Additional Medicare IM given by:    Discharge Disposition:    Per UR Regulation:  Reviewed for med. necessity/level of care/duration of stay  If discussed at Long Length of Stay Meetings, dates discussed:    Comments:  [id number]/Seniyah Esker Stark JockDavis, RN, BSN, [place] [phone] Chart Reviewed for discharge and hospital needs. Discharge needs at time of review: None present will follow for needs. Review of patient progress due on [id number].

## 2013-10-31 NOTE — Progress Notes (Signed)
Chaplain delivered bible in response to pt request.    Belva CromeStalnaker, Nakia Koble Wayne MDiv

## 2013-10-31 NOTE — H&P (Signed)
History and Physical  Marie Brooks GEX:[id number] DOB: [birth year] DOA: 10/30/2013  Referring physician: EDP PCP: No primary provider on file.  Outpatient Specialists:  1. None  Chief Complaint: Difficulty breathing.  HPI: Marie Brooks is a 53 y.o. female with history of COPD diagnosed in 2012, on when necessary home oxygen, ongoing tobacco abuse, hypertension, possible alcohol abuse, hospitalized twice at [facility] in May and June for pneumonia, hospitalized in early July at [facility] for ascites and underwent paracentesis-told to have cirrhosis, initially informed to have CHF but then rescinded, readmitted to [facility] a day after discharge for complaints of worsening dyspnea, and then signed out AMA on 10/30/13 and presented to med [facility] with complaints of dyspnea. She gives a three-day history of worsening dyspnea, wheezing, cough which is nonproductive, subjective fevers and substernal chest pain on coughing or deep breathing which is not radiating. She was admitted to [facility]. She was told to have a COPD flareup and? Pneumonia. As per EDP at [facility], CT chest without contrast on July 12 showed clear lungs with small right-sided effusion and anasarca of the soft tissues, chest x-ray from July 11 showed COPD changes with by basilar airspace opacities. She states that she left the hospital AMA because she was dissatisfied with the care. At [facility], she was found to be in respiratory distress which improved after treatment for COPD, chest ultrasound showed small pericardial effusion without tamponade features and lab work showed glucose 228, WBC 19.7, hemoglobin 10.3, platelets 87 and chest x-ray showed emphysema/COPD and possible small right sided pleural effusion. She was transferred to [facility] long step down unit for management.   Review of Systems: All systems reviewed and apart  from history of presenting illness, are negative.  Past Medical History  Diagnosis Date  . COPD (chronic obstructive pulmonary disease)   . CHF (congestive heart failure)   . HTN (hypertension)   . Ascites   . Cirrhosis   . Tobacco abuse   . Alcohol abuse    Past Surgical History  Procedure Laterality Date  . Paracentesis N/A     Early July 2015.   Social History:  reports that she has been smoking.  She does not have any smokeless tobacco history on file. She reports that she drinks alcohol. She reports that she does not use illicit drugs. She states that she has been smoking for 30 years and smoked a pack per day until June of this year when she cut down to half a pack and of late to 2-3 cigarettes daily. She claims to drink 2 quarts of beers 3 times per week. She denies drug abuse.  No Known Allergies  Family History  Problem Relation Age of Onset  . Diabetes Brother     Prior to Admission medications   Not on File   Physical Exam: Filed Vitals:   10/30/13 2230 10/30/13 2256 10/30/13 2258 10/31/13 0000  BP: 143/70 127/87    Pulse: 93 88    Temp:  98.3 F (36.8 C)  98.4 F (36.9 C)  TempSrc:  Oral  Oral  Resp: 15 18 18    Height:      Weight:      SpO2: 93% 96% 94%      General exam: Moderately built and nourished middle-aged female patient, lying comfortably propped up in bed in no obvious distress.  Head, eyes and ENT: Nontraumatic and  normocephalic. Pupils equally reacting to light and accommodation. Oral mucosa moist.  Neck: Supple. No JVD, carotid bruit or thyromegaly.  Lymphatics: No lymphadenopathy.  Respiratory system: Reduced breath sounds bilaterally with scattered occasional expiratory rhonchi and occasional basal crackles. No increased work of breathing. Able to speak in full sentences.  Cardiovascular system: S1 and S2 heard, RRR. No JVD, murmurs, gallops, clicks. Trace dorsal pedal edema.  Gastrointestinal system: Abdomen is soft and nontender.  Abdomen is mildly distended with possible ascites. Normal bowel sounds heard. No organomegaly or masses appreciated.  Central nervous system: Alert and oriented. No focal neurological deficits.  Extremities: Symmetric 5 x 5 power. Peripheral pulses symmetrically felt.   Skin: No rashes or acute findings.  Musculoskeletal system: Negative exam.  Psychiatry: Pleasant and cooperative.   Labs on Admission:  Basic Metabolic Panel:  Recent Labs Lab 10/30/13 1935  NA 139  K 4.7  CL 94*  CO2 32  GLUCOSE 228*  BUN 10  CREATININE 0.80  CALCIUM 9.1   Liver Function Tests:  Recent Labs Lab 10/30/13 1935  AST 28  ALT 18  ALKPHOS 219*  BILITOT 0.6  PROT 6.8  ALBUMIN 3.3*   No results found for this basename: LIPASE, AMYLASE,  in the last 168 hours No results found for this basename: AMMONIA,  in the last 168 hours CBC:  Recent Labs Lab 10/30/13 1935  WBC 19.7*  NEUTROABS 17.8*  HGB 10.3*  HCT 29.7*  MCV 85.6  PLT 87*   Cardiac Enzymes:  Recent Labs Lab 10/30/13 1935  TROPONINI <0.30    BNP (last 3 results)  Recent Labs  10/30/13 1935  PROBNP 630.4*   CBG: No results found for this basename: GLUCAP,  in the last 168 hours  Radiological Exams on Admission: Dg Chest Portable 1 View  10/30/2013   CLINICAL DATA:  Shortness of breath.  History of CHF.  EXAM: PORTABLE CHEST - 1 VIEW  COMPARISON:  CT chest 10/29/2013 and chest radiograph 10/29/2013  FINDINGS: Heart mediastinal, and hilar contours are stable. There are emphysematous changes of the lungs. Slight blunting of the right costophrenic angle may reflect a small right pleural effusion. No airspace disease is identified.  IMPRESSION: 1.  Emphysema/COPD.  2.  Cannot exclude small right pleural effusion.   Electronically Signed   By: Britta Mccreedy M.D.   On: 10/30/2013 20:01    EKG: Independently reviewed. Sinus tachycardia at 106 beats per minute, normal axis and no acute  changes.  Assessment/Plan Principal Problem:   COPD exacerbation Active Problems:   HTN (hypertension)   Ascites   Cirrhosis   Tobacco abuse   Alcohol abuse   Acute on chronic respiratory failure with hypoxia   Thrombocytopenia, unspecified   1. COPD exacerbation: Admitted to step down unit. Treat with oxygen, bronchodilator nebulizations, levofloxacin and IV Solu-Medrol. Tobacco cessation counseled. 2. Acute on chronic hypoxic respiratory failure: Precipitated by COPD exacerbation. Continue oxygen and reassess her oxygen needs prior to discharge. Although BNP is mildly elevated, no clinical features suggestive of CHF. 3. Tobacco abuse: Cessation counseled. Patient declines nicotine patch. 4. Possible alcohol abuse: Placed on Ativan protocol. 5. Ascites/? Cirrhosis: Will request medical records from [facility]. Check abdominal ultrasound. No indication for urgent paracentesis at this time. Monitor. 6. Thrombocytopenia: May be secondary to cirrhosis and alcohol abuse. Monitor CBCs. 7. Normocytic anemia: No prior labs to compare. Follow CBCs. May be of chronic disease. 8. Hypertension: Controlled. 9. ? Chronic diastolic CHF: Clinically does not  appear in CHF. Chest pain is atypical and appears musculoskeletal from coughing. Troponin x1 negative. Check 2-D echo for LV function and to assess mild pericardial effusion seen on bedside ultrasound by EDP. 10. Hyperglycemia: Rule out diabetes. Check hemoglobin A1c and place on SSI. 11. Leukocytosis:? Secondary to steroids she may have received that prior hospitalization. Follow CBCs.     Code Status: Full  Family Communication: None at bedside  Disposition Plan: Home when medically stable.   Time spent: 70 minutes  Marie Deason, MD, FACP, FHM. [facility] Pager [phone]  If 7PM-7AM, please contact night-coverage [email or web address] Password [email or web address] 10/31/2013, 1:33 AM

## 2013-10-31 NOTE — Progress Notes (Signed)
Patient was scheduled for right therapeutic thoracentesis today. Upon reviewing images, minimal amount of right pleural fluid seen, not amendable to percutaneous removal, procedure cancelled.   Pattricia BossKoreen Allona Gondek PA-C Interventional Radiology  10/31/13 10:33 AM

## 2013-11-01 DIAGNOSIS — R079 Chest pain, unspecified: Secondary | ICD-10-CM

## 2013-11-01 DIAGNOSIS — R0602 Shortness of breath: Secondary | ICD-10-CM

## 2013-11-01 LAB — COMPREHENSIVE METABOLIC PANEL
ALK PHOS: 200 U/L — AB (ref 39–117)
ALT: 20 U/L (ref 0–35)
AST: 30 U/L (ref 0–37)
Albumin: 2.6 g/dL — ABNORMAL LOW (ref 3.5–5.2)
Anion gap: 8 (ref 5–15)
BUN: 14 mg/dL (ref 6–23)
CALCIUM: 8.8 mg/dL (ref 8.4–10.5)
CO2: 36 mEq/L — ABNORMAL HIGH (ref 19–32)
Chloride: 96 mEq/L (ref 96–112)
Creatinine, Ser: 0.66 mg/dL (ref 0.50–1.10)
GLUCOSE: 182 mg/dL — AB (ref 70–99)
POTASSIUM: 4.3 meq/L (ref 3.7–5.3)
SODIUM: 140 meq/L (ref 137–147)
Total Bilirubin: 0.5 mg/dL (ref 0.3–1.2)
Total Protein: 5.8 g/dL — ABNORMAL LOW (ref 6.0–8.3)

## 2013-11-01 LAB — CBC
HCT: 26.9 % — ABNORMAL LOW (ref 36.0–46.0)
Hemoglobin: 9.1 g/dL — ABNORMAL LOW (ref 12.0–15.0)
MCH: 29.5 pg (ref 26.0–34.0)
MCHC: 33.8 g/dL (ref 30.0–36.0)
MCV: 87.3 fL (ref 78.0–100.0)
PLATELETS: 96 10*3/uL — AB (ref 150–400)
RBC: 3.08 MIL/uL — ABNORMAL LOW (ref 3.87–5.11)
RDW: 15.3 % (ref 11.5–15.5)
WBC: 9.5 10*3/uL (ref 4.0–10.5)

## 2013-11-01 LAB — GLUCOSE, CAPILLARY
GLUCOSE-CAPILLARY: 240 mg/dL — AB (ref 70–99)
GLUCOSE-CAPILLARY: 293 mg/dL — AB (ref 70–99)
Glucose-Capillary: 211 mg/dL — ABNORMAL HIGH (ref 70–99)
Glucose-Capillary: 247 mg/dL — ABNORMAL HIGH (ref 70–99)
Glucose-Capillary: 276 mg/dL — ABNORMAL HIGH (ref 70–99)

## 2013-11-01 MED ORDER — FUROSEMIDE 20 MG PO TABS
20.0000 mg | ORAL_TABLET | Freq: Every day | ORAL | Status: DC
  Administered 2013-11-01 – 2013-11-02 (×2): 20 mg via ORAL
  Filled 2013-11-01 (×2): qty 1

## 2013-11-01 MED ORDER — NICOTINE 21 MG/24HR TD PT24
21.0000 mg | MEDICATED_PATCH | Freq: Every day | TRANSDERMAL | Status: DC
  Administered 2013-11-02: 21 mg via TRANSDERMAL
  Filled 2013-11-01: qty 1

## 2013-11-01 MED ORDER — DIAZEPAM 5 MG PO TABS: 10.0000 mg | ORAL_TABLET | Freq: Once | ORAL | Status: DC

## 2013-11-01 MED ORDER — VITAMINS A & D EX OINT
TOPICAL_OINTMENT | CUTANEOUS | Status: AC
  Administered 2013-11-01: 1
  Filled 2013-11-01: qty 5

## 2013-11-01 MED ORDER — IPRATROPIUM-ALBUTEROL 0.5-2.5 (3) MG/3ML IN SOLN
3.0000 mL | Freq: Four times a day (QID) | RESPIRATORY_TRACT | Status: DC
  Administered 2013-11-02 (×3): 3 mL via RESPIRATORY_TRACT
  Filled 2013-11-01 (×3): qty 3

## 2013-11-01 NOTE — Progress Notes (Signed)
Inpatient Diabetes Program Recommendations  AACE/ADA: New Consensus Statement on Inpatient Glycemic Control (2013)  Target Ranges:  Prepandial:   less than 140 mg/dL      Peak postprandial:   less than 180 mg/dL (1-2 hours)      Critically ill patients:  140 - 180 mg/dL   Reason for Visit: Hyperglycemia  Diabetes history: None Outpatient Diabetes medications: N/A Current orders for Inpatient glycemic control: Novolog sensitive tidwc  Steroid-induced hyperglycemia.  Inpatient Diabetes Program Recommendations Insulin - Basal: If FBS continues >180 mg/dL, add Lantus 8 units QHS Correction (SSI): Add HS correction Insulin - Meal Coverage: Consider Novolog 3 units tidwc for meal coverage insulin while pt on steroids HgbA1C: 6.2% - impaired glucose tolerance Diet: Add CHO mod to heart healthy diet  Note: Will follow. Thank you. Marie Brooks, RD, LDN, CDE Inpatient Diabetes Coordinator [phone]

## 2013-11-01 NOTE — Progress Notes (Signed)
VASCULAR LAB PRELIMINARY  PRELIMINARY  PRELIMINARY  PRELIMINARY  Bilateral lower extremity venous duplex  completed.    Preliminary report:  Bilateral:  No evidence of DVT, superficial thrombosis, or Baker's Cyst.    Nasean Zapf, RVT 11/01/2013, 9:48 AM

## 2013-11-01 NOTE — Progress Notes (Addendum)
[facility] PROGRESS NOTE  Marie Brooks ZOX:[id number] DOB: 1961-01-23 DOA: 10/30/2013 PCP: No primary provider on file.  Assessment/Plan: Principal Problem:   COPD exacerbation Active Problems:   HTN (hypertension)   Ascites   Cirrhosis   Tobacco abuse   Alcohol abuse   Acute on chronic respiratory failure with hypoxia   Thrombocytopenia, unspecified     1. COPD exacerbation: Admitted to step down unit initially. Treated with oxygen, bronchodilator nebulizations, switched levofloxacin to IV , continue IV Solu-Medrol. , Prior CT without contrast on 7/11 at [facility] did not show any acute pneumonia just a small pleural effusion on the right. Interval resolution of airspace disease seen in the right upper lobe. D-dimer elevated, VQ scan intermediate probability, Doppler of the lower extremities pending, Avoid contrast study given history of cirrhosis and increased risk of renal failure. Diurese as needed. Taper steroids, can be discharged in 2-3 days if continues to improve 2. Acute on chronic hypoxic respiratory failure: Precipitated by COPD exacerbation. Continue oxygen and reassess her oxygen needs prior to discharge. Although BNP is mildly elevated, no clinical features suggestive of CHF. 2-D echo was done and this was within normal limits, EF of 55-60% 3. Tobacco abuse: Cessation counseled. Patient declines nicotine patch. 4. Possible alcohol abuse: Placed on Ativan protocol. 5. Ascites/? Cirrhosis: Will request medical records from [facility]. Abdominal ultrasound shows prominent gallbladder wall thickening. No gallstones. No significant ascites. Resume Aldactone 6. Thrombocytopenia: May be secondary to cirrhosis and alcohol abuse. Monitor CBCs. 7. Normocytic anemia: No prior labs to compare. Follow CBCs. May be of chronic disease. 8. Hypertension: Controlled. 9. ? Chronic diastolic CHF: Clinically does not appear in CHF. Chest pain is atypical and  appears musculoskeletal from coughing. Troponin x1 negative. 2-D echo within normal limits, no pericardial effusion 10.  Hyperglycemia: Steroid induced, also a prediabetic, hemoglobin A1c of 6.2, continue sliding scale insulin 11. Leukocytosis improving:? Secondary to steroids, she may have received that prior hospitalization. Follow CBCs 12.  Code Status: full  Family Communication: family updated about patient's clinical progress  Disposition Plan:  anticipate discharge in 2-3 days, start mobilizing   Brief narrative:  53 y.o. female with history of COPD diagnosed in 2012, on when necessary home oxygen, ongoing tobacco abuse, hypertension, possible alcohol abuse, hospitalized twice at [facility] in May and June for pneumonia, hospitalized in early July at [facility] for ascites and underwent paracentesis-told to have cirrhosis, initially informed to have CHF but then rescinded, readmitted to [facility] a day after discharge for complaints of worsening dyspnea, and then signed out AMA on 10/30/13 and presented to med [facility] with complaints of dyspnea. She gives a three-day history of worsening dyspnea, wheezing, cough which is nonproductive, subjective fevers and substernal chest pain on coughing or deep breathing which is not radiating. She was admitted to [facility]. She was told to have a COPD flareup and? Pneumonia. As per EDP at [facility], CT chest without contrast on July 12 showed clear lungs with small right-sided effusion and anasarca of the soft tissues, chest x-ray from July 11 showed COPD changes with by basilar airspace opacities. She states that she left the hospital AMA because she was dissatisfied with the care. At [facility], she was found to be in respiratory distress which improved after treatment for COPD, chest ultrasound showed small pericardial effusion without tamponade features  and lab work showed  glucose 228, WBC 19.7, hemoglobin 10.3, platelets 87 and chest x-ray showed emphysema/COPD and possible small right sided pleural effusion. She was transferred to [facility] step down unit for management.  Consultants:  None  Procedures:  None  Antibiotics:  *Levofloxacin      HPI/Subjective: Shortness of breath much improved  Objective: Filed Vitals:   11/01/13 0400 11/01/13 0746 11/01/13 0800 11/01/13 1038  BP: 136/70  168/85 126/71  Pulse: 71  84 92  Temp:   98.1 F (36.7 C) 98.3 F (36.8 C)  TempSrc:   Axillary Oral  Resp: 15  24 22   Height:    5\' 7"  (1.702 m)  Weight:    70.171 kg (154 lb 11.2 oz)  SpO2: 98% 100% 100% 100%    Intake/Output Summary (Last 24 hours) at 11/01/13 1225 Last data filed at 11/01/13 1000  Gross per 24 hour  Intake    640 ml  Output   1850 ml  Net  -1210 ml    Exam:  HENT:  Head: Atraumatic.  Nose: Nose normal.  Mouth/Throat: Oropharynx is clear and moist.  Eyes: Conjunctivae are normal. Pupils are equal, round, and reactive to light. No scleral icterus.  Neck: Neck supple. No tracheal deviation present.  Cardiovascular: Normal rate, regular rhythm, normal heart sounds and intact distal pulses.  Pulmonary/Chest: Effort normal and breath sounds normal. No respiratory distress.  Abdominal: Soft. Normal appearance and bowel sounds are normal. She exhibits no distension. There is no tenderness.  Musculoskeletal: She exhibits no edema and no tenderness.  Neurological: She is alert. No cranial nerve deficit.    Data Reviewed: Basic Metabolic Panel:  Recent Labs Lab 10/30/13 1935 10/31/13 0403 11/01/13 0355  NA 139 137 140  K 4.7 4.2 4.3  CL 94* 94* 96  CO2 32 33* 36*  GLUCOSE 228* 244* 182*  BUN 10 12 14   CREATININE 0.80 0.78 0.66  CALCIUM 9.1 8.6 8.8    Liver Function Tests:  Recent Labs Lab 10/30/13 1935 11/01/13 0355  AST 28 30  ALT 18 20  ALKPHOS 219* 200*  BILITOT 0.6 0.5  PROT 6.8 5.8*   ALBUMIN 3.3* 2.6*   No results found for this basename: LIPASE, AMYLASE,  in the last 168 hours No results found for this basename: AMMONIA,  in the last 168 hours  CBC:  Recent Labs Lab 10/30/13 1935 10/31/13 0403 11/01/13 0355  WBC 19.7* 15.6* 9.5  NEUTROABS 17.8*  --   --   HGB 10.3* 8.8* 9.1*  HCT 29.7* 25.7* 26.9*  MCV 85.6 87.1 87.3  PLT 87* 77* 96*    Cardiac Enzymes:  Recent Labs Lab 10/30/13 1935  TROPONINI <0.30   BNP (last 3 results)  Recent Labs  10/30/13 1935  PROBNP 630.4*     CBG:  Recent Labs Lab 10/31/13 0748 10/31/13 1310 10/31/13 1709 10/31/13 2214 11/01/13 0746  GLUCAP 188* 149* 226* 211* 247*    Recent Results (from the past 240 hour(s))  MRSA PCR SCREENING     Status: None   Collection Time    10/30/13 11:45 PM      Result Value Ref Range Status   MRSA by PCR NEGATIVE  NEGATIVE Final   Comment:            The GeneXpert MRSA Assay (FDA     approved for NASAL specimens     only), is one component of a     comprehensive MRSA colonization     surveillance program.  It is not     intended to diagnose MRSA     infection nor to guide or     monitor treatment for     MRSA infections.     Studies: [place] Chest  10/31/2013   CLINICAL DATA:  Right effusion  EXAM: CHEST ULTRASOUND  COMPARISON:  None.  FINDINGS: Preliminary ultrasound evaluation of the right hemithorax was performed as a prelude to possible thoracentesis. A small effusion is noted although no safe window to allow for thoracentesis is noted.  IMPRESSION: Small effusion although no thoracentesis was performed.   Electronically Signed   By: Alcide Clever M.D.   On: 10/31/2013 11:27   US Abdomen Complete  10/31/2013   CLINICAL DATA:  Cirrhosis.  EXAM: ULTRASOUND ABDOMEN COMPLETE  COMPARISON:  None.  FINDINGS: Gallbladder:  No gallstones noted. The gallbladder wall is thickened and measures 8 mm. This could be secondary to hypoproteinemia, particularly given the patient's history  of cirrhosis and ascites. Acalculous cholecystitis cannot be excluded. No gallbladder distention. No pericholecystic fluid collection. Negative Murphy sign.  Common bile duct:  Diameter: 4 mm  Liver:  Liver parenchyma is coarse and nodular suggesting cirrhosis. Mild intrahepatic biliary ductal distention is present.  IVC:  No abnormality visualized.  Pancreas:  Not visualized.  Spleen:  Size and appearance within normal limits.  Right Kidney:  Length: 12.5 cm. Echogenicity within normal limits. No mass or hydronephrosis visualized.  Left Kidney:  Length: 12.1 cm. Echogenicity within normal limits. No mass or hydronephrosis visualized.  Abdominal aorta:  No aneurysm visualized.  Other findings:  Ascites.  Small right pleural effusion.  IMPRESSION: 1. Prominent gallbladder wall thickening. This is most likely secondary to hyperproteinemia given the patient's history of cirrhosis and ascites. 2. Small nodular liver consistent with cirrhosis. Mild intrahepatic biliary ductal dilatation is present. No common bile duct distention. No gallstones. 3. Ascites.  Small right pleural effusion.   Electronically Signed   By: Maisie Fus  Register   On: 10/31/2013 11:35   Nm Pulmonary Perf And Vent  10/31/2013   CLINICAL DATA:  Elevated D-dimer.  EXAM: NUCLEAR MEDICINE VENTILATION - PERFUSION LUNG SCAN  TECHNIQUE: Ventilation images were obtained in multiple projections using inhaled aerosol technetium 99 M DTPA. Perfusion images were obtained in multiple projections after intravenous injection of Tc-8m MAA.  RADIOPHARMACEUTICALS:  33.0 mCi Tc-46m DTPA aerosol and 5.5 mCi Tc-12m MAA  COMPARISON:  Chest x-ray 10/30/2013, ultrasound chest 10/31/2013, chest CT 10/29/2013.  FINDINGS: Ventilation: Multi focal patchy ventilation defects.  Perfusion: Large matched defect noted over the left mid lung. This appears to involve the major fissure and posterior pleural surface on lateral view. This could represent pleural fluid collection.  Pleural fluid was noted on ultrasound chest today. A repeat PA and lateral chest x-ray suggested for further evaluation.  IMPRESSION: 1. Technically indeterminate scan for pulmonary embolus. Underlying COPD is most likely present. 2. Large matching defect left lung. This appears to be present in the major fissure and along the posterior pleural surface. This may be related to pleural effusion. A repeat PA and lateral chest x-ray is suggested to exclude a focal pulmonary infiltrate.   Electronically Signed   By: Maisie Fus  Register   On: 10/31/2013 15:28   Dg Chest Portable 1 View  10/30/2013   CLINICAL DATA:  Shortness of breath.  History of CHF.  EXAM: PORTABLE CHEST - 1 VIEW  COMPARISON:  CT chest 10/29/2013 and chest radiograph 10/29/2013  FINDINGS: Heart mediastinal, and hilar contours are  stable. There are emphysematous changes of the lungs. Slight blunting of the right costophrenic angle may reflect a small right pleural effusion. No airspace disease is identified.  IMPRESSION: 1.  Emphysema/COPD.  2.  Cannot exclude small right pleural effusion.   Electronically Signed   By: Britta Mccreedy M.D.   On: 10/30/2013 20:01    Scheduled Meds: . enoxaparin (LOVENOX) injection  40 mg Subcutaneous Q24H  . guaiFENesin  600 mg Oral BID  . insulin aspart  0-9 Units Subcutaneous TID WC  . ipratropium-albuterol  3 mL Nebulization Q6H  . levofloxacin (LEVAQUIN) IV  500 mg Intravenous Q24H  . methylPREDNISolone (SOLU-MEDROL) injection  60 mg Intravenous 3 times per day  . mometasone-formoterol  2 puff Inhalation BID  . multivitamin with minerals  1 tablet Oral Daily  . sodium chloride  3 mL Intravenous Q12H  . spironolactone  50 mg Oral BID  . thiamine  100 mg Oral Daily   Continuous Infusions:   Principal Problem:   COPD exacerbation Active Problems:   HTN (hypertension)   Ascites   Cirrhosis   Tobacco abuse   Alcohol abuse   Acute on chronic respiratory failure with hypoxia   Thrombocytopenia,  unspecified    Time spent: 40 minutes   [facility]  [facility] Pager [phone]. If 8PM-8AM, please contact night-coverage at [email or web address], password [facility] 11/01/2013, 12:25 PM  LOS: 2 days

## 2013-11-02 DIAGNOSIS — K7030 Alcoholic cirrhosis of liver without ascites: Secondary | ICD-10-CM

## 2013-11-02 LAB — GLUCOSE, CAPILLARY
Glucose-Capillary: 176 mg/dL — ABNORMAL HIGH (ref 70–99)
Glucose-Capillary: 369 mg/dL — ABNORMAL HIGH (ref 70–99)

## 2013-11-02 LAB — BASIC METABOLIC PANEL
ANION GAP: 7 (ref 5–15)
BUN: 12 mg/dL (ref 6–23)
CO2: 36 mEq/L — ABNORMAL HIGH (ref 19–32)
CREATININE: 0.64 mg/dL (ref 0.50–1.10)
Calcium: 8.7 mg/dL (ref 8.4–10.5)
Chloride: 95 mEq/L — ABNORMAL LOW (ref 96–112)
GFR calc non Af Amer: >90 mL/min (ref >90)
Glucose, Bld: 212 mg/dL — ABNORMAL HIGH (ref 70–99)
POTASSIUM: 3.9 meq/L (ref 3.7–5.3)
Sodium: 138 mEq/L (ref 137–147)

## 2013-11-02 LAB — CBC
HCT: 24.9 % — ABNORMAL LOW (ref 36.0–46.0)
HEMOGLOBIN: 8.7 g/dL — AB (ref 12.0–15.0)
MCH: 29.4 pg (ref 26.0–34.0)
MCHC: 34.9 g/dL (ref 30.0–36.0)
MCV: 84.1 fL (ref 78.0–100.0)
PLATELETS: 107 10*3/uL — AB (ref 150–400)
RBC: 2.96 MIL/uL — AB (ref 3.87–5.11)
RDW: 15.6 % — ABNORMAL HIGH (ref 11.5–15.5)
WBC: 6.3 10*3/uL (ref 4.0–10.5)

## 2013-11-02 MED ORDER — ALBUTEROL SULFATE HFA 108 (90 BASE) MCG/ACT IN AERS: 2.0000 | INHALATION_SPRAY | Freq: Four times a day (QID) | RESPIRATORY_TRACT | Status: AC | PRN

## 2013-11-02 MED ORDER — LEVOFLOXACIN 750 MG PO TABS: 750.0000 mg | ORAL_TABLET | Freq: Every day | ORAL | Status: DC

## 2013-11-02 MED ORDER — PREDNISONE 20 MG PO TABS: 20.0000 mg | ORAL_TABLET | Freq: Every day | ORAL | Status: DC

## 2013-11-02 MED ORDER — INSULIN ASPART 100 UNIT/ML ~~LOC~~ SOLN
3.0000 [IU] | Freq: Three times a day (TID) | SUBCUTANEOUS | Status: DC
  Administered 2013-11-02 (×2): 3 [IU] via SUBCUTANEOUS

## 2013-11-02 MED ORDER — GUAIFENESIN ER 600 MG PO TB12: 600.0000 mg | ORAL_TABLET | Freq: Two times a day (BID) | ORAL | Status: DC

## 2013-11-02 MED ORDER — NICOTINE 21 MG/24HR TD PT24: 21.0000 mg | MEDICATED_PATCH | Freq: Every day | TRANSDERMAL | Status: AC

## 2013-11-02 NOTE — Discharge Instructions (Signed)
Finding Treatment for Alcohol and Drug Addiction It can be hard to find the right place to get professional treatment. Here are some important things to consider:  There are different types of treatment to choose from.  Some programs are live-in (residential) while others are not (outpatient). Sometimes a combination is offered.  No single type of program is right for everyone.  Most treatment programs involve a combination of education, counseling, and a 12-step, spiritually-based approach.  There are non-spiritually based programs (not 12-step).  Some treatment programs are government sponsored. They are geared for patients without private insurance.  Treatment programs can vary in many respects such as:  Cost and types of insurance accepted.  Types of on-site medical services offered.  Length of stay, setting, and size.  Overall philosophy of treatment. A person may need specialized treatment or have needs not addressed by all programs. For example, adolescents need treatment appropriate for their age. Other people have secondary disorders that must be managed as well. Secondary conditions can include mental illness, such as depression or diabetes. Often, a period of detoxification from alcohol or drugs is needed. This requires medical supervision and not all programs offer this. THINGS TO CONSIDER WHEN SELECTING A TREATMENT PROGRAM   Is the program certified by the appropriate government agency? Even private programs must be certified and employ certified professionals.  Does the program accept your insurance? If not, can a payment plan be set up?  Is the facility clean, organized, and well run? Do they allow you to speak with graduates who can share their treatment experience with you? Can you tour the facility? Can you meet with staff?  Does the program meet the full range of individual needs?  Does the treatment program address sexual orientation and physical disabilities?  Do they provide age, gender, and culturally appropriate treatment services?  Is treatment available in languages other than English?  Is long-term aftercare support or guidance encouraged and provided?  Is assessment of an individual's treatment plan ongoing to ensure it meets changing needs?  Does the program use strategies to encourage reluctant patients to remain in treatment long enough to increase the likelihood of success?  Does the program offer counseling (individual or group) and other behavioral therapies?  Does the program offer medicine as part of the treatment regimen, if needed?  Is there ongoing monitoring of possible relapse? Is there a defined relapse prevention program? Are services or referrals offered to family members to ensure they understand addiction and the recovery process? This would help them support the recovering individual.  Are 12-step meetings held at the center or is transport available for patients to attend outside meetings? In countries outside of the [place]. and [place], Magazine features editor for contact information for services in your area. Document Released: 03/06/2005 Document Revised: 06/30/2011 Document Reviewed: 09/16/2007 [facility] Patient Information 2015 [place], [place]. This information is not intended to replace advice given to you by your health care provider. Make sure you discuss any questions you have with your health care provider.   Chronic Obstructive Pulmonary Disease Chronic obstructive pulmonary disease (COPD) is a common lung problem. In COPD, the flow of air from the lungs is limited. The way your lungs work will probably never return to normal, but there are things you can do to improve you lungs and make yourself feel better. HOME CARE  Take all medicines as told by your doctor.  Only take over-the-counter or prescription medicines as told by your doctor.  Avoid medicines  or cough syrups that dry up your airway (such as  antihistamines) and do not allow you to get rid of thick spit. You do not need to avoid them if told differently by your doctor.  If you smoke, stop. Smoking makes the problem worse.  Avoid being around things that make your breathing worse (like smoke, chemicals, and fumes).  Use oxygen therapy and therapy to help improve your lungs (pulmonary rehabilitation) if told by your doctor. If you need home oxygen therapy, ask your doctor if you should buy a tool to measure your oxygen level (oximeter).  Avoid people who have a sickness you can catch (contagious).  Avoid going outside when it is very hot, cold, or humid.  Eat healthy foods. Eat smaller meals more often. Rest before meals.  Stay active, but remember to also rest.  Make sure to get all the shots (vaccines) your doctor recommends. Ask your doctor if you need a pneumonia shot.  Learn and use tips on how to relax.  Learn and use tips on how to control your breathing as told by your doctor. Try:  Breathing in (inhaling) through your nose for 1 second. Then, pucker your lips and breath out (exhale) through your lips for 2 seconds.  Putting one hand on your belly (abdomen). Breathe in slowly through your nose for 1 second. Your hand on your belly should move out. Pucker your lips and breathe out slowly through your lips. Your hand on your belly should move in as you breathe out.  Learn and use controlled coughing to clear thick spit from your lungs. 1. Lean your head a little forward. 2. Breathe in deeply. 3. Try to hold your breath for 3 seconds. 4. Keep your mouth slightly open while coughing 2 times. 5. Spit any thick spit out into a tissue. 6. Rest and do the steps again 1 or 2 times as needed. GET HELP IF:  You cough up more thick spit than usual.  There is a change in the color or thickness of the spit.  It is harder to breathe than usual.  Your breathing is faster than usual. GET HELP RIGHT AWAY IF:   You have  shortness of breath while resting.  You have shortness of breath that stops you from:  Being able to talk.  Doing normal activities.  You chest hurts for longer than 5 minutes.  Your skin color is more blue than usual.  Your pulse oximeter shows that you have low oxygen for longer than 5 minutes. MAKE SURE YOU:   Understand these instructions.  Will watch your condition.  Will get help right away if you are not doing well or get worse. Document Released: 09/24/2007 Document Revised: 01/26/2013 Document Reviewed: 12/02/2012 [facility] Patient Information 2015 [employer]. This information is not intended to replace advice given to you by your health care provider. Make sure you discuss any questions you have with your health care provider.

## 2013-11-02 NOTE — Progress Notes (Signed)
CARE MANAGEMENT NOTE 11/02/2013  Patient:  Marie Brooks,Marie Brooks   Account Number:  [id number]  Date Initiated:  10/31/2013  Documentation initiated by:  DAVIS,RHONDA  Subjective/Objective Assessment:   HX OF COPD WITH ACTIVE TOBACCO USE.  admitted with fever, and elevate wbc poss pna versus plueral effusion.     Action/Plan:   home when stable   Anticipated DC Date:  11/03/2013   Anticipated DC Plan:  HOME/SELF CARE  In-house referral  NA      DC Planning Services  NA      PAC Choice  NA   Choice offered to / List presented to:  NA   DME arranged  NA      DME agency  NA     HH arranged  HH-1 RN  HH-6 SOCIAL WORKER      HH agency  [facility]   Status of service:  In process, will continue to follow Medicare Important Message given?  NA - LOS <3 / Initial given by admissions (If response is "NO", the following Medicare IM given date fields will be blank) Date Medicare IM given:   Medicare IM given by:   Date Additional Medicare IM given:   Additional Medicare IM given by:    Discharge Disposition:    Per UR Regulation:  Reviewed for med. necessity/level of care/duration of stay  If discussed at Long Length of Stay Meetings, dates discussed:    Comments:  11/02/13 MMcGibboney, RN, BSN Pt is active with [facility] and will continue with HHRN,HHCSW. [facility] updated.  [id number]/Rhonda Earlene Plateravis, RN, BSN, CCM [id number] Chart Reviewed for discharge and hospital needs. Discharge needs at time of review: None present will follow for needs. Review of patient progress due on [id number].

## 2013-11-02 NOTE — Discharge Summary (Addendum)
Physician Discharge Summary  Trudi IdaVera Dicaprio ZOX:[id number] DOB: 12/18/1960 DOA: 10/30/2013  PCP: Duane LopeROXLER,FABIANA R, NP  Admit date: 10/30/2013 Discharge date: 11/02/2013  Time spent: 40  minutes  Recommendations for Outpatient Follow-up:  1. Discharge home with home health PT, RN and SW 2. Followup with PCP (has appointment on 7/17). Hx repeat CBC for hemoglobin and platelets during outpatient followup. 3. Follow up with GI as outpatient ( reportedly supposed to see Dr Ermelinda DasBadreddin as outpt for her cirrhosis). Patient instructed to schedule appointment   Discharge Diagnoses:  Principal Problem:   COPD exacerbation  Active Problems:   HTN (hypertension)   Ascites   Cirrhosis    Tobacco abuse   Alcohol abuse   Acute on chronic respiratory failure with hypoxia   Thrombocytopenia, unspecified   Discharge Condition: Fair  Diet recommendation: Low sodium  Filed Weights   10/31/13 0100 11/01/13 1038 11/02/13 0551  Weight: 70.5 kg (155 lb 6.8 oz) 70.171 kg (154 lb 11.2 oz) 72.848 kg (160 lb 9.6 oz)    History of present illness:  Please refer to admission H&P for details, but in brief, 53 year old female with history of COPD on 2L  home  O2 as needed, ongoing tobacco and alcohol abuse, liver cirrhosis, hypertension, anxiety who was recently hospitalized at [facility] for pneumonia and subsequently one week back for small bowel obstruction and ascites. Patient underwent paracentesis which was negative for SBP. Her small bowel obstruction subsequently resolved with supportive care alone. Patient was discharged from there on 10/27/2013. She came to the med [facility] ED on 7/12 for 3 days history of worsening dyspnea with wheezing and nonproductive cough. She also reported subjective fever with substernal chest pain on coughing and deep inspiration. At [facility], she was found to be in respiratory distress which improved after treatment for COPD, chest ultrasound showed  small pericardial effusion without tamponade features and lab work showed glucose 228, WBC 19.7, hemoglobin 10.3, platelets 87 and chest x-ray showed emphysema/COPD and possible small right sided pleural effusion. She was [facility] to [facility] long step down unit for management of acute hypoxic respiratory failure due to underlying COPD exacerbation.. patient also had a VQ scan done on admission which showed low probability for PE.    Hospital Course:  Acute hypoxic respiratory failure secondary to COPD exacerbation Patient admitted to step down unit. Patient placed on oxygen via nasal cannula, IV Solu Medrol, scheduled nebulizers and empiric Levaquin. Supportive care with antitussives. Patient was strongly counseled on smoking cessation and nicotine patch applied. Symptoms have now improved. Patient will be discharged on prednisone taper over next 12 days. Patient is clinically stable and can be discharged home. I will prescribe her a few more days of oral Levaquin. Patient to continue Advair , spiriva and prn nebs. Will add advair inhaler prn for wheezing and shortness of breath. Followup with PCP as outpatient.   Alcohol abuse with history of alcoholic cirrhosis Patient [facility] hospitalized [facility] requiting paracentesis. LFTs are stable at present. She has  elevated alkaline phosphatase (noted to be high from labs at [facility] recently) . Abdominal ultrasound repeated here shows prominent gall bladder wall thickening without any  Gall stones or ascites. Continue Lasix and Aldactone. Patient should followup with GI as outpatient and have informed her to schedule an appointment.  Anemia and thrombocytopenia Patient has slight drop in her hemoglobin and platelets ( was 10.8 and 146 at [facility] recently). Likely  related to cirrhosis of the liver. No signs of bleeding. Hemoglobin and crit should be monitored with outpatient followup.  Chronic diastolic  CHF Appears euvolemic. Patient reported all chest pain on admission which is atypical and likely musculoskeletal from coughing. 2-D echo shows normal EF and no pericardial effusion. Continue Lasix.  Hyperglycemia Likely steroid induced. A1c of 6.2. Followup as outpatient  Hypertension Depression stable. Followup as outpatient  Leukocytosis Likely secondary to steroid and now improving.  Alcohol abuse Patient strongly counseled on alcohol cessation. He reports drinking 2-3 times a week at present. No signs of withdrawal. She plans to quit alcohol from now on.   Diet: Low sodium  Patient is clinically stable to be discharged home with outpatient followup   Procedures:  None  Consultations:  None  CODE STATUS: Full code  Family communication: None bedside   Discharge Exam: Filed Vitals:   11/02/13 0551  BP: 144/79  Pulse: 76  Temp: 98.2 F (36.8 C)  Resp: 20    General: Middle aged female in no acute distress HEENT: No pallor, moist oral mucosa Chest: Clear to auscultation bilaterally, nontender sounds CVS: Normal S1 and S2, no murmurs rub or gallop Abdomen: Soft, nontender, nondistended, bowel sounds present Extremities: Warm, no edema CNS: AAO x3  Discharge Instructions You were cared for by a hospitalist during your hospital stay. If you have any questions about your discharge medications or the care you received while you were in the hospital after you are discharged, you can call the unit and asked to speak with the hospitalist on call if the hospitalist that took care of you is not available. Once you are discharged, your primary care physician will handle any further medical issues. Please note that NO REFILLS for any discharge medications will be authorized once you are discharged, as it is imperative that you return to your primary care physician (or establish a relationship with a primary care physician if you do not have one) for your aftercare needs so  that they can reassess your need for medications and monitor your lab values.     Medication List         albuterol (2.5 MG/3ML) 0.083% nebulizer solution  Commonly known as:  PROVENTIL  Take 2.5 mg by nebulization every 6 (six) hours as needed for wheezing or shortness of breath.     albuterol 108 (90 BASE) MCG/ACT inhaler  Commonly known as:  PROVENTIL HFA;VENTOLIN HFA  Inhale 2 puffs into the lungs every 6 (six) hours as needed for wheezing or shortness of breath.     carvedilol 3.125 MG tablet  Commonly known as:  COREG  Take 3.125 mg by mouth 2 (two) times daily with a meal.     Fluticasone-Salmeterol 250-50 MCG/DOSE Aepb  Commonly known as:  ADVAIR  Inhale 1 puff into the lungs 2 (two) times daily.     furosemide 20 MG tablet  Commonly known as:  LASIX  Take 20 mg by mouth daily.     guaiFENesin 600 MG 12 hr tablet  Commonly known as:  MUCINEX  Take 1 tablet (600 mg total) by mouth 2 (two) times daily.     levofloxacin 750 MG tablet  Commonly known as:  LEVAQUIN  Take 1 tablet (750 mg total) by mouth daily.until 11/05/2013      lisinopril 10 MG tablet  Commonly known as:  PRINIVIL,ZESTRIL  Take 10 mg by mouth daily.     nicotine 21 mg/24hr patch  Commonly known as:  NICODERM CQ - dosed in mg/24 hours  Place 1 patch (21 mg total) onto the skin daily.     pantoprazole 40 MG tablet  Commonly known as:  PROTONIX  Take 40 mg by mouth daily.     potassium chloride SA 20 MEQ tablet  Commonly known as:  K-DUR,KLOR-CON  Take 20 mEq by mouth daily.     predniSONE 20 MG tablet  Commonly known as:  DELTASONE  Take 1 tablet (20 mg total) by mouth daily with breakfast. (40 mg daily for 3 days, then 30 mg daily for next 3 days, then 20 mg daily for next 3 days then 10 mg daily for next 3 days then stop )     spironolactone 100 MG tablet  Commonly known as:  ALDACTONE  Take 100 mg by mouth daily.     tiotropium 18 MCG inhalation capsule  Commonly known as:  SPIRIVA   Place 18 mcg into inhaler and inhale daily.       No Known Allergies     Follow-up Information   Follow up with Duane Lope, NP On 11/04/2013. (has appointment)    Specialty:  Nurse Practitioner   Contact information:   [address] [place] [address] [phone]       Follow up with BADREDDINE,RAMI, MD. Schedule an appointment as soon as possible for a visit in 4 weeks. ( [address], [place], [place] [address]; phone: [phone])    Specialty:  Internal Medicine       The results of significant diagnostics from this hospitalization (including imaging, microbiology, ancillary and laboratory) are listed below for reference.    Significant Diagnostic Studies: [place] Chest  10/31/2013   CLINICAL DATA:  Right effusion  EXAM: CHEST ULTRASOUND  COMPARISON:  None.  FINDINGS: Preliminary ultrasound evaluation of the right hemithorax was performed as a prelude to possible thoracentesis. A small effusion is noted although no safe window to allow for thoracentesis is noted.  IMPRESSION: Small effusion although no thoracentesis was performed.   Electronically Signed   By: Alcide Clever M.D.   On: 10/31/2013 11:27   US Abdomen Complete  10/31/2013   CLINICAL DATA:  Cirrhosis.  EXAM: ULTRASOUND ABDOMEN COMPLETE  COMPARISON:  None.  FINDINGS: Gallbladder:  No gallstones noted. The gallbladder wall is thickened and measures 8 mm. This could be secondary to hypoproteinemia, particularly given the patient's history of cirrhosis and ascites. Acalculous cholecystitis cannot be excluded. No gallbladder distention. No pericholecystic fluid collection. Negative Murphy sign.  Common bile duct:  Diameter: 4 mm  Liver:  Liver parenchyma is coarse and nodular suggesting cirrhosis. Mild intrahepatic biliary ductal distention is present.  IVC:  No abnormality visualized.  Pancreas:  Not visualized.  Spleen:  Size and appearance within normal limits.  Right Kidney:  Length: 12.5 cm. Echogenicity  within normal limits. No mass or hydronephrosis visualized.  Left Kidney:  Length: 12.1 cm. Echogenicity within normal limits. No mass or hydronephrosis visualized.  Abdominal aorta:  No aneurysm visualized.  Other findings:  Ascites.  Small right pleural effusion.  IMPRESSION: 1. Prominent gallbladder wall thickening. This is most likely secondary to hyperproteinemia given the patient's history of cirrhosis and ascites. 2. Small nodular liver consistent with cirrhosis. Mild intrahepatic biliary ductal dilatation is present. No common bile duct distention. No gallstones. 3. Ascites.  Small right pleural effusion.   Electronically Signed   By: Maisie Fus  Register   On: 10/31/2013 11:35   Nm Pulmonary Perf  And Vent  10/31/2013   CLINICAL DATA:  Elevated D-dimer.  EXAM: NUCLEAR MEDICINE VENTILATION - PERFUSION LUNG SCAN  TECHNIQUE: Ventilation images were obtained in multiple projections using inhaled aerosol technetium 99 M DTPA. Perfusion images were obtained in multiple projections after intravenous injection of Tc-84m MAA.  RADIOPHARMACEUTICALS:  33.0 mCi Tc-3m DTPA aerosol and 5.5 mCi Tc-87m MAA  COMPARISON:  Chest x-ray 10/30/2013, ultrasound chest 10/31/2013, chest CT 10/29/2013.  FINDINGS: Ventilation: Multi focal patchy ventilation defects.  Perfusion: Large matched defect noted over the left mid lung. This appears to involve the major fissure and posterior pleural surface on lateral view. This could represent pleural fluid collection. Pleural fluid was noted on ultrasound chest today. A repeat PA and lateral chest x-ray suggested for further evaluation.  IMPRESSION: 1. Technically indeterminate scan for pulmonary embolus. Underlying COPD is most likely present. 2. Large matching defect left lung. This appears to be present in the major fissure and along the posterior pleural surface. This may be related to pleural effusion. A repeat PA and lateral chest x-ray is suggested to exclude a focal pulmonary  infiltrate.   Electronically Signed   By: Maisie Fus  Register   On: 10/31/2013 15:28   Dg Chest Portable 1 View  10/30/2013   CLINICAL DATA:  Shortness of breath.  History of CHF.  EXAM: PORTABLE CHEST - 1 VIEW  COMPARISON:  CT chest 10/29/2013 and chest radiograph 10/29/2013  FINDINGS: Heart mediastinal, and hilar contours are stable. There are emphysematous changes of the lungs. Slight blunting of the right costophrenic angle may reflect a small right pleural effusion. No airspace disease is identified.  IMPRESSION: 1.  Emphysema/COPD.  2.  Cannot exclude small right pleural effusion.   Electronically Signed   By: Britta Mccreedy M.D.   On: 10/30/2013 20:01    Microbiology: Recent Results (from the past 240 hour(s))  MRSA PCR SCREENING     Status: None   Collection Time    10/30/13 11:45 PM      Result Value Ref Range Status   MRSA by PCR NEGATIVE  NEGATIVE Final   Comment:            The GeneXpert MRSA Assay (FDA     approved for NASAL specimens     only), is one component of a     comprehensive MRSA colonization     surveillance program. It is not     intended to diagnose MRSA     infection nor to guide or     monitor treatment for     MRSA infections.     Labs: Basic Metabolic Panel:  Recent Labs Lab 10/30/13 1935 10/31/13 0403 11/01/13 0355 11/02/13 0452  NA 139 137 140 138  K 4.7 4.2 4.3 3.9  CL 94* 94* 96 95*  CO2 32 33* 36* 36*  GLUCOSE 228* 244* 182* 212*  BUN 10 12 14 12   CREATININE 0.80 0.78 0.66 0.64  CALCIUM 9.1 8.6 8.8 8.7   Liver Function Tests:  Recent Labs Lab 10/30/13 1935 11/01/13 0355  AST 28 30  ALT 18 20  ALKPHOS 219* 200*  BILITOT 0.6 0.5  PROT 6.8 5.8*  ALBUMIN 3.3* 2.6*   No results found for this basename: LIPASE, AMYLASE,  in the last 168 hours No results found for this basename: AMMONIA,  in the last 168 hours CBC:  Recent Labs Lab 10/30/13 1935 10/31/13 0403 11/01/13 0355 11/02/13 0452  WBC 19.7* 15.6* 9.5 6.3  NEUTROABS  17.8*  --   --   --  HGB 10.3* 8.8* 9.1* 8.7*  HCT 29.7* 25.7* 26.9* 24.9*  MCV 85.6 87.1 87.3 84.1  PLT 87* 77* 96* 107*   Cardiac Enzymes:  Recent Labs Lab 10/30/13 1935  TROPONINI <0.30   BNP: BNP (last 3 results)  Recent Labs  10/30/13 1935  PROBNP 630.4*   CBG:  Recent Labs Lab 11/01/13 1302 11/01/13 1617 11/01/13 2120 11/02/13 0728 11/02/13 1157  GLUCAP 240* 276* 293* 176* 369*       Signed:  Kierria Feigenbaum  [facility] 11/02/2013, 12:31 PM

## 2014-12-14 ENCOUNTER — Encounter (HOSPITAL_COMMUNITY): Payer: Self-pay | Admitting: Emergency Medicine

## 2014-12-14 ENCOUNTER — Emergency Department (HOSPITAL_COMMUNITY): Payer: Medicare Other

## 2014-12-14 ENCOUNTER — Inpatient Hospital Stay (HOSPITAL_COMMUNITY)
Admission: EM | Admit: 2014-12-14 | Discharge: 2014-12-21 | DRG: 871 | Disposition: A | Discharge status: Home Health | Payer: Medicare Other | Attending: Internal Medicine | Admitting: Internal Medicine

## 2014-12-14 DIAGNOSIS — R739 Hyperglycemia, unspecified: Secondary | ICD-10-CM | POA: Diagnosis present

## 2014-12-14 DIAGNOSIS — R319 Hematuria, unspecified: Secondary | ICD-10-CM | POA: Diagnosis present

## 2014-12-14 DIAGNOSIS — D62 Acute posthemorrhagic anemia: Secondary | ICD-10-CM | POA: Diagnosis present

## 2014-12-14 DIAGNOSIS — R0902 Hypoxemia: Secondary | ICD-10-CM

## 2014-12-14 DIAGNOSIS — R6520 Severe sepsis without septic shock: Secondary | ICD-10-CM | POA: Diagnosis present

## 2014-12-14 DIAGNOSIS — A419 Sepsis, unspecified organism: Secondary | ICD-10-CM | POA: Diagnosis present

## 2014-12-14 DIAGNOSIS — K860 Alcohol-induced chronic pancreatitis: Secondary | ICD-10-CM | POA: Diagnosis not present

## 2014-12-14 DIAGNOSIS — L299 Pruritus, unspecified: Secondary | ICD-10-CM | POA: Diagnosis present

## 2014-12-14 DIAGNOSIS — D684 Acquired coagulation factor deficiency: Secondary | ICD-10-CM | POA: Diagnosis present

## 2014-12-14 DIAGNOSIS — D638 Anemia in other chronic diseases classified elsewhere: Secondary | ICD-10-CM | POA: Diagnosis present

## 2014-12-14 DIAGNOSIS — I5042 Chronic combined systolic (congestive) and diastolic (congestive) heart failure: Secondary | ICD-10-CM | POA: Diagnosis present

## 2014-12-14 DIAGNOSIS — D649 Anemia, unspecified: Secondary | ICD-10-CM | POA: Diagnosis present

## 2014-12-14 DIAGNOSIS — I959 Hypotension, unspecified: Secondary | ICD-10-CM | POA: Diagnosis present

## 2014-12-14 DIAGNOSIS — R140 Abdominal distension (gaseous): Secondary | ICD-10-CM

## 2014-12-14 DIAGNOSIS — D6959 Other secondary thrombocytopenia: Secondary | ICD-10-CM | POA: Diagnosis present

## 2014-12-14 DIAGNOSIS — E872 Acidosis: Secondary | ICD-10-CM | POA: Diagnosis present

## 2014-12-14 DIAGNOSIS — I10 Essential (primary) hypertension: Secondary | ICD-10-CM | POA: Diagnosis present

## 2014-12-14 DIAGNOSIS — D696 Thrombocytopenia, unspecified: Secondary | ICD-10-CM | POA: Diagnosis present

## 2014-12-14 DIAGNOSIS — F1010 Alcohol abuse, uncomplicated: Secondary | ICD-10-CM | POA: Diagnosis present

## 2014-12-14 DIAGNOSIS — F17210 Nicotine dependence, cigarettes, uncomplicated: Secondary | ICD-10-CM | POA: Diagnosis present

## 2014-12-14 DIAGNOSIS — K652 Spontaneous bacterial peritonitis: Secondary | ICD-10-CM | POA: Diagnosis present

## 2014-12-14 DIAGNOSIS — B9620 Unspecified Escherichia coli [E. coli] as the cause of diseases classified elsewhere: Secondary | ICD-10-CM | POA: Diagnosis present

## 2014-12-14 DIAGNOSIS — K7031 Alcoholic cirrhosis of liver with ascites: Secondary | ICD-10-CM | POA: Diagnosis present

## 2014-12-14 DIAGNOSIS — K766 Portal hypertension: Secondary | ICD-10-CM | POA: Diagnosis present

## 2014-12-14 DIAGNOSIS — Z833 Family history of diabetes mellitus: Secondary | ICD-10-CM | POA: Diagnosis not present

## 2014-12-14 DIAGNOSIS — Z9981 Dependence on supplemental oxygen: Secondary | ICD-10-CM

## 2014-12-14 DIAGNOSIS — R6521 Severe sepsis with septic shock: Secondary | ICD-10-CM | POA: Diagnosis present

## 2014-12-14 DIAGNOSIS — R195 Other fecal abnormalities: Secondary | ICD-10-CM | POA: Diagnosis present

## 2014-12-14 DIAGNOSIS — R188 Other ascites: Secondary | ICD-10-CM | POA: Diagnosis not present

## 2014-12-14 DIAGNOSIS — J9621 Acute and chronic respiratory failure with hypoxia: Secondary | ICD-10-CM | POA: Diagnosis present

## 2014-12-14 DIAGNOSIS — E876 Hypokalemia: Secondary | ICD-10-CM | POA: Diagnosis present

## 2014-12-14 DIAGNOSIS — Z79899 Other long term (current) drug therapy: Secondary | ICD-10-CM

## 2014-12-14 DIAGNOSIS — R6510 Systemic inflammatory response syndrome (SIRS) of non-infectious origin without acute organ dysfunction: Secondary | ICD-10-CM

## 2014-12-14 DIAGNOSIS — K469 Unspecified abdominal hernia without obstruction or gangrene: Secondary | ICD-10-CM | POA: Diagnosis present

## 2014-12-14 DIAGNOSIS — J449 Chronic obstructive pulmonary disease, unspecified: Secondary | ICD-10-CM | POA: Diagnosis not present

## 2014-12-14 DIAGNOSIS — R0602 Shortness of breath: Secondary | ICD-10-CM | POA: Diagnosis present

## 2014-12-14 DIAGNOSIS — D689 Coagulation defect, unspecified: Secondary | ICD-10-CM | POA: Diagnosis present

## 2014-12-14 DIAGNOSIS — E871 Hypo-osmolality and hyponatremia: Secondary | ICD-10-CM | POA: Diagnosis present

## 2014-12-14 DIAGNOSIS — K922 Gastrointestinal hemorrhage, unspecified: Secondary | ICD-10-CM | POA: Diagnosis present

## 2014-12-14 DIAGNOSIS — J441 Chronic obstructive pulmonary disease with (acute) exacerbation: Secondary | ICD-10-CM | POA: Diagnosis present

## 2014-12-14 DIAGNOSIS — R1030 Lower abdominal pain, unspecified: Secondary | ICD-10-CM | POA: Diagnosis present

## 2014-12-14 DIAGNOSIS — K429 Umbilical hernia without obstruction or gangrene: Secondary | ICD-10-CM | POA: Insufficient documentation

## 2014-12-14 DIAGNOSIS — K861 Other chronic pancreatitis: Secondary | ICD-10-CM | POA: Diagnosis present

## 2014-12-14 DIAGNOSIS — A4151 Sepsis due to Escherichia coli [E. coli]: Principal | ICD-10-CM | POA: Diagnosis present

## 2014-12-14 DIAGNOSIS — N390 Urinary tract infection, site not specified: Secondary | ICD-10-CM | POA: Diagnosis present

## 2014-12-14 HISTORY — DX: Anemia in other chronic diseases classified elsewhere: D63.8

## 2014-12-14 HISTORY — DX: Unspecified abdominal hernia without obstruction or gangrene: K46.9

## 2014-12-14 HISTORY — DX: Thrombocytopenia, unspecified: D69.6

## 2014-12-14 HISTORY — DX: Other chronic pancreatitis: K86.1

## 2014-12-14 HISTORY — DX: Acute posthemorrhagic anemia: D62

## 2014-12-14 HISTORY — DX: Gastrointestinal hemorrhage, unspecified: K92.2

## 2014-12-14 HISTORY — DX: Coagulation defect, unspecified: D68.9

## 2014-12-14 HISTORY — DX: Alcoholic cirrhosis of liver with ascites: K70.31

## 2014-12-14 LAB — URINE MICROSCOPIC-ADD ON

## 2014-12-14 LAB — URINALYSIS, ROUTINE W REFLEX MICROSCOPIC
GLUCOSE, UA: NEGATIVE mg/dL
KETONES UR: NEGATIVE mg/dL
Nitrite: POSITIVE — AB
PROTEIN: 100 mg/dL — AB
Specific Gravity, Urine: 1.016 (ref 1.005–1.030)
Urobilinogen, UA: 0.2 mg/dL (ref 0.0–1.0)
pH: 6 (ref 5.0–8.0)

## 2014-12-14 LAB — I-STAT CG4 LACTIC ACID, ED
LACTIC ACID, VENOUS: 1.03 mmol/L (ref 0.5–2.0)
Lactic Acid, Venous: 2.74 mmol/L (ref 0.5–2.0)

## 2014-12-14 LAB — CBC WITH DIFFERENTIAL/PLATELET
BASOS PCT: 1 % (ref 0–1)
Basophils Absolute: 0.1 10*3/uL (ref 0.0–0.1)
EOS ABS: 0.1 10*3/uL (ref 0.0–0.7)
Eosinophils Relative: 1 % (ref 0–5)
HCT: 18.4 % — ABNORMAL LOW (ref 36.0–46.0)
Hemoglobin: 5.9 g/dL — CL (ref 12.0–15.0)
LYMPHS ABS: 2.1 10*3/uL (ref 0.7–4.0)
Lymphocytes Relative: 17 % (ref 12–46)
MCH: 26.5 pg (ref 26.0–34.0)
MCHC: 32.1 g/dL (ref 30.0–36.0)
MCV: 82.5 fL (ref 78.0–100.0)
MONO ABS: 0.9 10*3/uL (ref 0.1–1.0)
Monocytes Relative: 7 % (ref 3–12)
NEUTROS ABS: 9.4 10*3/uL — AB (ref 1.7–7.7)
Neutrophils Relative %: 74 % (ref 43–77)
PLATELETS: 185 10*3/uL (ref 150–400)
RBC: 2.23 MIL/uL — ABNORMAL LOW (ref 3.87–5.11)
RDW: 16.1 % — AB (ref 11.5–15.5)
WBC: 12.6 10*3/uL — ABNORMAL HIGH (ref 4.0–10.5)

## 2014-12-14 LAB — LACTATE DEHYDROGENASE, PLEURAL OR PERITONEAL FLUID: LD FL: 52 U/L — AB (ref 3–23)

## 2014-12-14 LAB — COMPREHENSIVE METABOLIC PANEL
ALBUMIN: 2.8 g/dL — AB (ref 3.5–5.0)
ALK PHOS: 139 U/L — AB (ref 38–126)
ALT: 15 U/L (ref 14–54)
AST: 38 U/L (ref 15–41)
Anion gap: 7 (ref 5–15)
BILIRUBIN TOTAL: 2.4 mg/dL — AB (ref 0.3–1.2)
BUN: 9 mg/dL (ref 6–20)
CALCIUM: 7.9 mg/dL — AB (ref 8.9–10.3)
CO2: 29 mmol/L (ref 22–32)
CREATININE: 0.77 mg/dL (ref 0.44–1.00)
Chloride: 96 mmol/L — ABNORMAL LOW (ref 101–111)
GFR calc Af Amer: >60 mL/min (ref >60)
GFR calc non Af Amer: >60 mL/min (ref >60)
GLUCOSE: 103 mg/dL — AB (ref 65–99)
Potassium: 3.8 mmol/L (ref 3.5–5.1)
SODIUM: 132 mmol/L — AB (ref 135–145)
Total Protein: 6.9 g/dL (ref 6.5–8.1)

## 2014-12-14 LAB — ALBUMIN, FLUID (OTHER)

## 2014-12-14 LAB — PROTIME-INR
INR: 1.79 — ABNORMAL HIGH (ref 0.00–1.49)
Prothrombin Time: 20.7 seconds — ABNORMAL HIGH (ref 11.6–15.2)

## 2014-12-14 LAB — PROTEIN, BODY FLUID

## 2014-12-14 LAB — GLUCOSE, PERITONEAL FLUID: Glucose, Peritoneal Fluid: 122 mg/dL

## 2014-12-14 MED ORDER — SODIUM CHLORIDE 0.9 % IV SOLN: 10.0000 mL/h | Freq: Once | INTRAVENOUS | Status: DC

## 2014-12-14 MED ORDER — ALBUMIN HUMAN 25 % IV SOLN
25.0000 g | Freq: Once | INTRAVENOUS | Status: AC
  Administered 2014-12-14: 25 g via INTRAVENOUS
  Filled 2014-12-14: qty 100

## 2014-12-14 MED ORDER — PIPERACILLIN-TAZOBACTAM 3.375 G IVPB: 3.3750 g | Freq: Three times a day (TID) | INTRAVENOUS | Status: DC

## 2014-12-14 MED ORDER — PIPERACILLIN-TAZOBACTAM 3.375 G IVPB 30 MIN
3.3750 g | Freq: Once | INTRAVENOUS | Status: AC
  Administered 2014-12-14: 3.375 g via INTRAVENOUS
  Filled 2014-12-14: qty 50

## 2014-12-14 MED ORDER — SODIUM CHLORIDE 0.9 % IV BOLUS (SEPSIS): 500.0000 mL | INTRAVENOUS | Status: DC

## 2014-12-14 MED ORDER — VANCOMYCIN HCL IN DEXTROSE 1-5 GM/200ML-% IV SOLN
1000.0000 mg | Freq: Once | INTRAVENOUS | Status: AC
  Administered 2014-12-14: 1000 mg via INTRAVENOUS
  Filled 2014-12-14: qty 200

## 2014-12-14 MED ORDER — LIDOCAINE HCL 1 % IJ SOLN
INTRAMUSCULAR | Status: AC
Start: 2014-12-14 — End: 2014-12-15
  Filled 2014-12-14: qty 20

## 2014-12-14 MED ORDER — LIDOCAINE HCL (PF) 1 % IJ SOLN: 5.0000 mL | Freq: Once | INTRAMUSCULAR | Status: DC

## 2014-12-14 MED ORDER — SODIUM CHLORIDE 0.9 % IV BOLUS (SEPSIS)
1000.0000 mL | Freq: Once | INTRAVENOUS | Status: AC
  Administered 2014-12-14: 1000 mL via INTRAVENOUS

## 2014-12-14 MED ORDER — LIDOCAINE HCL (PF) 1 % IJ SOLN
INTRAMUSCULAR | Status: AC
  Filled 2014-12-14: qty 5

## 2014-12-14 MED ORDER — VANCOMYCIN HCL IN DEXTROSE 750-5 MG/150ML-% IV SOLN: 750.0000 mg | Freq: Two times a day (BID) | INTRAVENOUS | Status: DC

## 2014-12-14 MED ORDER — SODIUM CHLORIDE 0.9 % IV BOLUS (SEPSIS): 1000.0000 mL | INTRAVENOUS | Status: DC

## 2014-12-14 NOTE — ED Notes (Signed)
Bed: RU04 Expected date:  Expected time:  Means of arrival:  Comments: EMS 54 yo female SOB/liver disease/ascites/wheezing-albuterol

## 2014-12-14 NOTE — Progress Notes (Signed)
ANTIBIOTIC CONSULT NOTE - INITIAL  Pharmacy Consult for vancomycin and zosyn Indication: empiric coverage  No Known Allergies  Patient Measurements: Height:  (170.2 cm) IBW/kg (Calculated) : 61.6  Weight 59 kg  Vital Signs: Temp: 103.7 F (39.8 C) (08/25 1935) Temp Source: Rectal (08/25 1935) BP: 123/58 mmHg (08/25 1935) Pulse Rate: 106 (08/25 1935) Intake/Output from previous day:   Intake/Output from this shift:    Labs: No results for input(s): WBC, HGB, PLT, LABCREA, CREATININE in the last 72 hours. CrCl cannot be calculated (Unknown ideal weight.). No results for input(s): VANCOTROUGH, VANCOPEAK, VANCORANDOM, GENTTROUGH, GENTPEAK, GENTRANDOM, TOBRATROUGH, TOBRAPEAK, TOBRARND, AMIKACINPEAK, AMIKACINTROU, AMIKACIN in the last 72 hours.   Microbiology: No results found for this or any previous visit (from the past 720 hour(s)).  Medical History: Past Medical History  Diagnosis Date  . COPD (chronic obstructive pulmonary disease)   . CHF (congestive heart failure)   . HTN (hypertension)   . Ascites   . Cirrhosis   . Tobacco abuse   . Alcohol abuse     Assessment: Patient's a 54 y.o F presented to the ED on 8/25 with c/o SOB, diarrhea, fever, and ascites.  To start vancomycin and zosyn for broad empiric coverage.  Goal of Therapy:  Vancomycin trough level 15-20 mcg/ml  Plan:  - zosyn 3.375 gm IV x1 over 30 minutes, then 3.375gm IV q8h (infuse over 4 hours) - vancomycin 1gm IV x1, then  IV q12h  Marie Brooks P 12/14/2014,7:59 PM

## 2014-12-14 NOTE — ED Provider Notes (Signed)
CSN: [id number]     Arrival date & time 12/14/14  1921 History   First MD Initiated Contact with Patient 12/14/14 1929     Chief Complaint  Patient presents with  . Shortness of Breath    HPI   Kimball Manske is a 54 y.o. female with a PMH of COPD, CHF, HTN, chronic alcohol abuse, cirrhosis, and ascites who presents to the ED with shortness of breath. She arrived via EMS and was given 2 breathing treatments in route. Patient reports her symptoms began 2 days ago, and she has developed a cough productive of clear sputum, congestion, and shortness of breath. She reports her shortness of breath is exacerbated by activity. She denies alleviating factors. She also reports increased abdominal bloating, abdominal pain, and diarrhea x 2 days. She reports headache with coughing. She denies lightheadedness, dizziness, syncope, weakness. She denies chest pain, palpitations. She denies nausea, vomiting, hematochezia, melena, dysuria, urgency, frequency. She states she "always" has a fever.  Of note, the patient was recently admitted on 2 separate occasions in July 2016. Initially, she was admitted for SBO, which resolved spontaneously, and PNA. Subsequently, she was admitted for COPD exacerbation and hypoxic respiratory failure.   Past Medical History  Diagnosis Date  . COPD (chronic obstructive pulmonary disease)   . CHF (congestive heart failure)   . HTN (hypertension)   . Ascites   . Cirrhosis   . Tobacco abuse   . Alcohol abuse    Past Surgical History  Procedure Laterality Date  . Paracentesis N/A     Early July 2015.   Family History  Problem Relation Age of Onset  . Diabetes Brother    Social History  Substance Use Topics  . Smoking status: Current Every Day Smoker -- 0.25 packs/day for 30 years  . Smokeless tobacco: None  . Alcohol Use: Yes   OB History    No data available      Review of Systems  Constitutional: Positive for fever. Negative for chills, diaphoresis, activity  change, appetite change and fatigue.  HENT: Positive for congestion.   Eyes: Negative for visual disturbance.  Respiratory: Positive for cough, shortness of breath and wheezing.   Cardiovascular: Negative for chest pain, palpitations and leg swelling.  Gastrointestinal: Positive for abdominal pain, diarrhea and abdominal distention. Negative for nausea, vomiting, constipation and blood in stool.  Genitourinary: Negative for dysuria, urgency and frequency.  Musculoskeletal: Negative for myalgias, back pain, arthralgias, neck pain and neck stiffness.  Skin: Negative for color change, pallor, rash and wound.  Neurological: Positive for headaches. Negative for dizziness, syncope, weakness, light-headedness and numbness.  All other systems reviewed and are negative.    Allergies  Review of patient's allergies indicates no known allergies.  Home Medications   Prior to Admission medications   Medication Sig Start Date End Date Taking? Authorizing Provider  albuterol (PROVENTIL) (2.5 MG/3ML) 0.083% nebulizer solution Inhale 2.5 mg into the lungs every 4 (four) hours as needed. 12/03/14 12/03/15 Yes Historical Provider, MD  carvedilol (COREG) 3.125 MG tablet Take 3.125 mg by mouth 2 (two) times daily with a meal.   Yes Historical Provider, MD  Fluticasone-Salmeterol (ADVAIR) 250-50 MCG/DOSE AEPB Inhale 1 puff into the lungs 2 (two) times daily.   Yes Historical Provider, MD  furosemide (LASIX) 20 MG tablet Take 20 mg by mouth daily.   Yes Historical Provider, MD  hydrocortisone 2.5 % cream Apply 1 application topically 2 (two) times daily. 12/03/14 12/03/15 Yes Historical Provider, MD  lactulose (  CHRONULAC) 10 GM/15ML solution Take 15 mLs by mouth daily. 12/03/14  Yes Historical Provider, MD  lisinopril (PRINIVIL,ZESTRIL) 10 MG tablet Take 10 mg by mouth daily.   Yes Historical Provider, MD  nicotine (NICODERM CQ - DOSED IN MG/24 HOURS) 21 mg/24hr patch Place 1 patch (21 mg total) onto the skin daily.  11/02/13  Yes Nishant Dhungel, MD  pantoprazole (PROTONIX) 40 MG tablet Take 40 mg by mouth daily.   Yes Historical Provider, MD  potassium chloride SA (K-DUR,KLOR-CON) 20 MEQ tablet Take 20 mEq by mouth daily.   Yes Historical Provider, MD  rifaximin (XIFAXAN) 550 MG TABS tablet Take 550 mg by mouth 2 (two) times daily. 12/03/14  Yes Historical Provider, MD  spironolactone (ALDACTONE) 100 MG tablet Take 100 mg by mouth daily.   Yes Historical Provider, MD  SYMBICORT 160-4.5 MCG/ACT inhaler Inhale 2 puffs into the lungs 2 (two) times daily. 11/23/14  Yes Historical Provider, MD  tiotropium (SPIRIVA) 18 MCG inhalation capsule Place 18 mcg into inhaler and inhale daily.   Yes Historical Provider, MD  albuterol (PROVENTIL HFA;VENTOLIN HFA) 108 (90 BASE) MCG/ACT inhaler Inhale 2 puffs into the lungs every 6 (six) hours as needed for wheezing or shortness of breath. 11/02/13   Nishant Dhungel, MD  guaiFENesin (MUCINEX) 600 MG 12 hr tablet Take 1 tablet (600 mg total) by mouth 2 (two) times daily. Patient not taking: Reported on 12/14/2014 11/02/13   Nishant Dhungel, MD  levofloxacin (LEVAQUIN) 750 MG tablet Take 1 tablet (750 mg total) by mouth daily. Patient not taking: Reported on 12/14/2014 11/02/13   Flonnie Overman Dhungel, MD  predniSONE (DELTASONE) 20 MG tablet Take 1 tablet (20 mg total) by mouth daily with breakfast. Patient not taking: Reported on 12/14/2014 11/02/13   Nishant Dhungel, MD    BP 115/49 mmHg  Pulse 102  Temp(Src) 102.6 F (39.2 C) (Rectal)  Resp 19  Ht 5' 7"  (1.702 m)  Wt 130 lb (58.968 kg)  BMI 20.36 kg/m2  SpO2 100% Physical Exam  Constitutional: She is oriented to person, place, and time. No distress.  HENT:  Head: Normocephalic and atraumatic.  Right Ear: External ear normal.  Left Ear: External ear normal.  Nose: Nose normal.  Mouth/Throat: Uvula is midline, oropharynx is clear and moist and mucous membranes are normal. No oropharyngeal exudate.  Eyes: Conjunctivae, EOM and  lids are normal. Pupils are equal, round, and reactive to light. Right eye exhibits no discharge. Left eye exhibits no discharge. No scleral icterus.  Neck: Normal range of motion. Neck supple.  Cardiovascular: Normal rate, regular rhythm, normal heart sounds, intact distal pulses and normal pulses.   Pulmonary/Chest: Effort normal. No accessory muscle usage. No respiratory distress. She has wheezes in the right upper field and the left upper field. She has no rales.  Wheezing to upper lung fields bilaterally.  Abdominal: She exhibits distension and ascites. She exhibits no mass. Bowel sounds are decreased. There is generalized tenderness. There is no rigidity, no rebound and no guarding.  Abdomen distended with diffuse tenderness to palpation.   Musculoskeletal: Normal range of motion. She exhibits no edema or tenderness.  Neurological: She is alert and oriented to person, place, and time. She has normal strength.  Skin: Skin is warm, dry and intact. No rash noted. She is not diaphoretic. No erythema. No pallor.  Psychiatric: She has a normal mood and affect. Her speech is normal and behavior is normal. Judgment and thought content normal.  Nursing note and vitals reviewed.  ED Course  Procedures (including critical care time)  Labs Review Labs Reviewed  COMPREHENSIVE METABOLIC PANEL - Abnormal; Notable for the following:    Sodium 132 (*)    Chloride 96 (*)    Glucose, Bld 103 (*)    Calcium 7.9 (*)    Albumin 2.8 (*)    Alkaline Phosphatase 139 (*)    Total Bilirubin 2.4 (*)    All other components within normal limits  CBC WITH DIFFERENTIAL/PLATELET - Abnormal; Notable for the following:    WBC 12.6 (*)    RBC 2.23 (*)    Hemoglobin 5.9 (*)    HCT 18.4 (*)    RDW 16.1 (*)    Neutro Abs 9.4 (*)    All other components within normal limits  URINALYSIS, ROUTINE W REFLEX MICROSCOPIC (NOT AT [facility]) - Abnormal; Notable for the following:    Color, Urine ORANGE (*)    APPearance  TURBID (*)    Hgb urine dipstick LARGE (*)    Bilirubin Urine SMALL (*)    Protein, ur 100 (*)    Nitrite POSITIVE (*)    Leukocytes, UA LARGE (*)    All other components within normal limits  LACTATE DEHYDROGENASE, BODY FLUID - Abnormal; Notable for the following:    LD, Fluid 52 (*)    All other components within normal limits  PROTIME-INR - Abnormal; Notable for the following:    Prothrombin Time 20.7 (*)    INR 1.79 (*)    All other components within normal limits  I-STAT CG4 LACTIC ACID, ED - Abnormal; Notable for the following:    Lactic Acid, Venous 2.74 (*)    All other components within normal limits  POC OCCULT BLOOD, ED - Abnormal; Notable for the following:    Fecal Occult Bld POSITIVE (*)    All other components within normal limits  CULTURE, BLOOD (ROUTINE X 2)  CULTURE, BLOOD (ROUTINE X 2)  URINE CULTURE  BODY FLUID CULTURE  MRSA PCR SCREENING  URINE MICROSCOPIC-ADD ON  GLUCOSE, PERITONEAL FLUID  PROTEIN, BODY FLUID  ALBUMIN, FLUID  I-STAT CG4 LACTIC ACID, ED  PREPARE RBC (CROSSMATCH)    Imaging Review Dg Chest 2 View  12/14/2014   CLINICAL DATA:  Fever.  EXAM: CHEST  2 VIEW  COMPARISON:  10/28/2014  FINDINGS: The heart size and mediastinal contours are within normal limits. Both lungs are clear. The visualized skeletal structures are unremarkable.  IMPRESSION: No active cardiopulmonary disease.   Electronically Signed   By: Kerby Moors M.D.   On: 12/14/2014 20:08     I have personally reviewed and evaluated these images and lab results as part of my medical decision-making.   EKG Interpretation None      MDM   Final diagnoses:  None    54 year old female presents to the ED with shortness of breath. Patient reports her symptoms began 2 days ago, and she has developed a cough productive of clear sputum, congestion, and shortness of breath. She reports headache with coughing. She also reports increased abdominal bloating, abdominal pain, and diarrhea  x 2 days. She denies lightheadedness, dizziness, syncope, weakness. She denies chest pain, palpitations. She denies nausea, vomiting, hematochezia, melena, dysuria, urgency, frequency.  Patient febrile to 104; did not give tylenol due to liver function, did not give ibuprofen due to anemia and concern for bleeding (hemoglobin 5.9). Patient tachycardic to 101-108. BP 09X-833A systolic. O2 sat 100% on 3L O2. Wheezing to upper lung fields bilaterally. Abdomen  distended, warm, and diffusely tender to palpation.   CBC remarkable for leukocytosis with WBC 12.6. Hemoglobin 5.9, will transfuse 2 units pRBCs. CMP with elevated alk phos and bili; ALT and AST within normal limits. UA with turbid appearance, positive nitrites, large leukocytes, TNTC WBC, consistent with urinary tract infection. Lactic acid 2.74.  Blood culture and urine cultures obtained. CXR no acute cardiopulmonary disease.  Patient discussed with Dr. Johnney Killian. Started on vanc and zosyn for empiric antibiotic coverage.  Abdominal paracentesis performed by Dr. Johnney Killian. Panel pending. Patient discussed with Dr. Humphrey Rolls, who will see the patient in the ED. Patient to be admitted.  EJ placed by Dr. Johnney Killian for additional vascular access.   BP 115/49 mmHg  Pulse 102  Temp(Src) 102.6 F (39.2 C) (Rectal)  Resp 19  Ht 5' 7"  (1.702 m)  Wt 130 lb (58.968 kg)  BMI 20.36 kg/m2  SpO2 100%     Marella Chimes, PA-C 12/15/14 0028  Charlesetta Shanks, MD 12/27/14 [phone]

## 2014-12-14 NOTE — ED Notes (Addendum)
Pt arrived to the ED from home via EMS with a complaint of shortness of breath.  Pt states that the bloating she has from ascites has caused her to have shortness of breath.  Pt has had a fever.  Pt was released from the hospital 10 days from this writing.  Pt was given two breathing treatment in route.  First treatment was 5 of albuterol and the second was 5 of albuterol and 0.5 of Atrovent.  Pt also has been complaining of a cough and diarrhea.

## 2014-12-14 NOTE — ED Notes (Signed)
Delay on 2nd set of blood culture, edp in doing procedure

## 2014-12-14 NOTE — ED Notes (Addendum)
Notified RN,Frances and EDP, Pheiffer,MD., pt. I-stat lactic acid results 2.74.

## 2014-12-14 NOTE — H&P (Signed)
[facility] History and Physical  Gerry Heaphy WJX:[id number] DOB: 1960-10-01 DOA: 12/14/2014  Referring physician: Glean Hess, PA PCP: Duane Lope, NP   Chief Complaint: Shortness of breath  HPI: Marie Brooks is a 54 y.o. female with history of cirrhosis HTN COPD CHF chronic ETOH abuse ascites presents to the ED with shortness of breath and belly swelling. She staets that over the last 2 days she has noted increased swelling of her abdominal and her periumbilical hernia was getting sore. She has had fevers also. She states that her naval area became more sore so she came in for evaluation. She has also had increased shortness of breath. She has had no chest pain. She has noted no nausea vomiting. She had diarrhea. She staets that her last drink was July 1st. Patient states that she has not passed out. She has had no syncope but has had some dizziness and fatigue. In the ED she had a paracentesis done and catheter was left in for a total of approximately 4 liters drainage. I have asked to hold further drainage for now. Labs have been sent on the fluid. In addition she has a Hgb of 5.5 and has a fever of 103 at this time. Cultures have been drawn.   Review of Systems:  12 point ROS performed and is unremarkable other than HPI.   Past Medical History  Diagnosis Date  . COPD (chronic obstructive pulmonary disease)   . CHF (congestive heart failure)   . HTN (hypertension)   . Ascites   . Cirrhosis   . Tobacco abuse   . Alcohol abuse    Past Surgical History  Procedure Laterality Date  . Paracentesis N/A     Early July 2015.   Social History:  reports that she has been smoking.  She does not have any smokeless tobacco history on file. She reports that she drinks alcohol. She reports that she does not use illicit drugs.  No Known Allergies  Family History  Problem Relation Age of Onset  . Diabetes Brother      Prior to Admission medications   Medication Sig Start  Date End Date Taking? Authorizing Provider  albuterol (PROVENTIL) (2.5 MG/3ML) 0.083% nebulizer solution Inhale 2.5 mg into the lungs every 4 (four) hours as needed. 12/03/14 12/03/15 Yes Historical Provider, MD  carvedilol (COREG) 3.125 MG tablet Take 3.125 mg by mouth 2 (two) times daily with a meal.   Yes Historical Provider, MD  Fluticasone-Salmeterol (ADVAIR) 250-50 MCG/DOSE AEPB Inhale 1 puff into the lungs 2 (two) times daily.   Yes Historical Provider, MD  furosemide (LASIX) 20 MG tablet Take 20 mg by mouth daily.   Yes Historical Provider, MD  hydrocortisone 2.5 % cream Apply 1 application topically 2 (two) times daily. 12/03/14 12/03/15 Yes Historical Provider, MD  lactulose (CHRONULAC) 10 GM/15ML solution Take 15 mLs by mouth daily. 12/03/14  Yes Historical Provider, MD  lisinopril (PRINIVIL,ZESTRIL) 10 MG tablet Take 10 mg by mouth daily.   Yes Historical Provider, MD  nicotine (NICODERM CQ - DOSED IN MG/24 HOURS) 21 mg/24hr patch Place 1 patch (21 mg total) onto the skin daily. 11/02/13  Yes Nishant Dhungel, MD  pantoprazole (PROTONIX) 40 MG tablet Take 40 mg by mouth daily.   Yes Historical Provider, MD  potassium chloride SA (K-DUR,KLOR-CON) 20 MEQ tablet Take 20 mEq by mouth daily.   Yes Historical Provider, MD  rifaximin (XIFAXAN) 550 MG TABS tablet Take 550 mg by mouth 2 (two) times daily. 12/03/14  Yes Historical Provider, MD  spironolactone (ALDACTONE) 100 MG tablet Take 100 mg by mouth daily.   Yes Historical Provider, MD  SYMBICORT 160-4.5 MCG/ACT inhaler Inhale 2 puffs into the lungs 2 (two) times daily. 11/23/14  Yes Historical Provider, MD  tiotropium (SPIRIVA) 18 MCG inhalation capsule Place 18 mcg into inhaler and inhale daily.   Yes Historical Provider, MD  albuterol (PROVENTIL HFA;VENTOLIN HFA) 108 (90 BASE) MCG/ACT inhaler Inhale 2 puffs into the lungs every 6 (six) hours as needed for wheezing or shortness of breath. 11/02/13   Nishant Dhungel, MD  guaiFENesin (MUCINEX) 600 MG 12  hr tablet Take 1 tablet (600 mg total) by mouth 2 (two) times daily. Patient not taking: Reported on 12/14/2014 11/02/13   Nishant Dhungel, MD  levofloxacin (LEVAQUIN) 750 MG tablet Take 1 tablet (750 mg total) by mouth daily. Patient not taking: Reported on 12/14/2014 11/02/13   Theda Belfast Dhungel, MD  predniSONE (DELTASONE) 20 MG tablet Take 1 tablet (20 mg total) by mouth daily with breakfast. Patient not taking: Reported on 12/14/2014 11/02/13   Eddie North, MD   Physical Exam: Filed Vitals:   12/14/14 1935 12/14/14 2005 12/14/14 2100  BP: 123/58    Pulse: 106    Temp: 103.7 F (39.8 C)  104 F (40 C)  TempSrc: Rectal  Rectal  Resp: 16    Height: 5\' 7"  (1.702 m)    Weight:  58.968 kg (130 lb)   SpO2: 97%      Wt Readings from Last 3 Encounters:  12/14/14 58.968 kg (130 lb)  11/02/13 72.848 kg (160 lb 9.6 oz)    General:  Appears comfortable Eyes: PERRL, normal lids, irises & conjunctiva ENT: grossly normal hearing, lips & tongue Neck: no LAD, masses or thyromegaly Cardiovascular: RRR, no m/r/g. No LE edema Respiratory: CTA bilaterally, no w/r/r. Normal respiratory effort. Abdomen: soft, distended periumbilical tenderness noted Skin: no rash or induration Musculoskeletal: grossly normal tone BUE/BLE Psychiatric: grossly normal Neurologic: grossly non-focal          Labs on Admission:  Basic Metabolic Panel:  Recent Labs Lab 12/14/14 2035  NA 132*  K 3.8  CL 96*  CO2 29  GLUCOSE 103*  BUN 9  CREATININE 0.77  CALCIUM 7.9*   Liver Function Tests:  Recent Labs Lab 12/14/14 2035  AST 38  ALT 15  ALKPHOS 139*  BILITOT 2.4*  PROT 6.9  ALBUMIN 2.8*   No results for input(s): LIPASE, AMYLASE in the last 168 hours. No results for input(s): AMMONIA in the last 168 hours. CBC:  Recent Labs Lab 12/14/14 2035  WBC 12.6*  NEUTROABS 9.4*  HGB 5.9*  HCT 18.4*  MCV 82.5  PLT 185   Cardiac Enzymes: No results for input(s): CKTOTAL, CKMB, CKMBINDEX,  TROPONINI in the last 168 hours.  BNP (last 3 results) No results for input(s): BNP in the last 8760 hours.  ProBNP (last 3 results) No results for input(s): PROBNP in the last 8760 hours.  CBG: No results for input(s): GLUCAP in the last 168 hours.  Radiological Exams on Admission: Dg Chest 2 View  12/14/2014   CLINICAL DATA:  Fever.  EXAM: CHEST  2 VIEW  COMPARISON:  10/28/2014  FINDINGS: The heart size and mediastinal contours are within normal limits. Both lungs are clear. The visualized skeletal structures are unremarkable.  IMPRESSION: No active cardiopulmonary disease.   Electronically Signed   By: Signa Kell M.D.   On: 12/14/2014 20:08      Assessment/Plan  Active Problems:   HTN (hypertension)   Ascites   Alcohol abuse   Symptomatic anemia   Sepsis   SIRS (systemic inflammatory response syndrome)   SBP (spontaneous bacterial peritonitis)   1. Sepsis/Hypotension -patient presented with fevers chills elevated WBC and probable UTI and while in the ED her pressure did drop after the paracentesis -will give albumin and fluid bolus now due to hypotension -sepsis protocol started in ED -will follow up results of paracentesis -will follow up with cultures -empiric antibiotics orderd cefotaxime along with vancocin and zosyn -I have called and discussed the case with Critical Care  2. Ascites with possible spontaneous bacterial peritionitis -paracentesis performed by ED with >4 liters removed which I have asked them to stop any further drainage -results awaited -empiric antibiotic coverage with cefotaxime vancocin and zosyn at this time  3. Symptomatic anemia -transfuse PRBC 2 units -will repeat labs in am  4. Ongoing ETOH -counseling provided on ETOH cessation -states her last drink was in July will monitor  5. HTN -will monitor pressures -continue with coreg diuretics lisinopril  6. Cirrhosis of Liver -will continue with lactulose -she had a paracentesis  in the ED await results and cultures -continue rifaximin and spironolactone  7. COPD -will be continued with albuterol and dulera tiotropium -oxygen therapy as needed     Code Status: full code (must indicate code status--if unknown or must be presumed, indicate so) DVT Prophylaxis:SCD Family Communication: none (indicate person spoken with, if applicable, with phone number if by telephone) Disposition Plan: home (indicate anticipated LOS)    [facility] Pager [phone]

## 2014-12-14 NOTE — ED Notes (Signed)
Patient transported to X-ray 

## 2014-12-14 NOTE — ED Notes (Signed)
Nurse starting IV will try to get 2nd set of blood cultures

## 2014-12-14 NOTE — Consult Note (Signed)
PULMONARY / CRITICAL CARE MEDICINE   Name: Marie Brooks MRN: [id number] DOB: Jul 10, 1960    ADMISSION DATE:  12/14/2014 CONSULTATION DATE:  12/14/2014  REFERRING MD :  Dr. Welton Flakes  CHIEF COMPLAINT:  Abd Pain  INITIAL PRESENTATION: 54 year old female with Alcoholic cirrhosis presented to [facility] 8/25 with complaints of abdominal pain and distention. In ED she was noted to profoundly anemic and hypotensive. Paracentesis was performed removing 4L. ED starting volume resuscitation now, PCCM to see.    STUDIES:  8/25 Hemoccult >>> 8/25 Ct abdomen/pelvis >>>  SIGNIFICANT EVENTS: 8/25 Admit >   HISTORY OF PRESENT ILLNESS:  55 year old female with PMH as outlined below, which includes Alcoholic cirrhosis (last drink July 1st), COPD, CHF, and HTN. She has 2 recent admissions for SBO (resolved spontaneously) and PNA, and then again for respiratory failure secondary to COPD exacerbation. 8/25 she presented to [facility] complaining of SOB, abdominal distension, diarrhea (melena), and painful umbilical hernia x 2 days. Also complained of fevers and cough productive for clear sputum. In ED she was noted to have marked abdominal distension, and significant protrusion of umbilical hernia. Fever initially 104. She was provided with bronchodilators which were moderately effective in addressing SOB. Paracentesis was performed and drained 4L ascites which was sent to lab. Catheter was left in. Shortly after paracentesis she became hypotensive with increased respiratory distress. CBC showed anemia with Hgb 5.9. Source unconfirmed, but GI suspected.  Blood was not administered initially due to fever. She received some albumin and a small bolus which helped her BP significantly. PCCM consulted.  PAST MEDICAL HISTORY :   has a past medical history of COPD (chronic obstructive pulmonary disease); CHF (congestive heart failure); HTN (hypertension); Ascites; Cirrhosis; Tobacco abuse; and Alcohol abuse.  has past surgical history that  includes Paracentesis (N/A). Prior to Admission medications   Medication Sig Start Date End Date Taking? Authorizing Provider  albuterol (PROVENTIL) (2.5 MG/3ML) 0.083% nebulizer solution Inhale 2.5 mg into the lungs every 4 (four) hours as needed. 12/03/14 12/03/15 Yes Historical Provider, MD  carvedilol (COREG) 3.125 MG tablet Take 3.125 mg by mouth 2 (two) times daily with a meal.   Yes Historical Provider, MD  Fluticasone-Salmeterol (ADVAIR) 250-50 MCG/DOSE AEPB Inhale 1 puff into the lungs 2 (two) times daily.   Yes Historical Provider, MD  furosemide (LASIX) 20 MG tablet Take 20 mg by mouth daily.   Yes Historical Provider, MD  hydrocortisone 2.5 % cream Apply 1 application topically 2 (two) times daily. 12/03/14 12/03/15 Yes Historical Provider, MD  lactulose (CHRONULAC) 10 GM/15ML solution Take 15 mLs by mouth daily. 12/03/14  Yes Historical Provider, MD  lisinopril (PRINIVIL,ZESTRIL) 10 MG tablet Take 10 mg by mouth daily.   Yes Historical Provider, MD  nicotine (NICODERM CQ - DOSED IN MG/24 HOURS) 21 mg/24hr patch Place 1 patch (21 mg total) onto the skin daily. 11/02/13  Yes Nishant Dhungel, MD  pantoprazole (PROTONIX) 40 MG tablet Take 40 mg by mouth daily.   Yes Historical Provider, MD  potassium chloride SA (K-DUR,KLOR-CON) 20 MEQ tablet Take 20 mEq by mouth daily.   Yes Historical Provider, MD  rifaximin (XIFAXAN) 550 MG TABS tablet Take 550 mg by mouth 2 (two) times daily. 12/03/14  Yes Historical Provider, MD  spironolactone (ALDACTONE) 100 MG tablet Take 100 mg by mouth daily.   Yes Historical Provider, MD  SYMBICORT 160-4.5 MCG/ACT inhaler Inhale 2 puffs into the lungs 2 (two) times daily. 11/23/14  Yes Historical Provider, MD  tiotropium (  SPIRIVA) 18 MCG inhalation capsule Place 18 mcg into inhaler and inhale daily.   Yes Historical Provider, MD  albuterol (PROVENTIL HFA;VENTOLIN HFA) 108 (90 BASE) MCG/ACT inhaler Inhale 2 puffs into the lungs every 6 (six) hours as needed for wheezing or  shortness of breath. 11/02/13   Nishant Dhungel, MD  guaiFENesin (MUCINEX) 600 MG 12 hr tablet Take 1 tablet (600 mg total) by mouth 2 (two) times daily. Patient not taking: Reported on 12/14/2014 11/02/13   Nishant Dhungel, MD  levofloxacin (LEVAQUIN) 750 MG tablet Take 1 tablet (750 mg total) by mouth daily. Patient not taking: Reported on 12/14/2014 11/02/13   Nishant Dhungel, MD  predniSONE (DELTASONE) 20 MG tablet Take 1 tablet (20 mg total) by mouth daily with breakfast. Patient not taking: Reported on 12/14/2014 11/02/13   Theda Belfast Dhungel, MD   No Known Allergies  FAMILY HISTORY:  has no family status information on file.  SOCIAL HISTORY:  reports that she has been smoking.  She does not have any smokeless tobacco history on file. She reports that she drinks alcohol. She reports that she does not use illicit drugs.  REVIEW OF SYSTEMS:   Bolds are positive  Constitutional: weight loss, gain, night sweats, Fevers, chills, fatigue .  Hungry HEENT: headaches, Sore throat, sneezing, nasal congestion, post nasal drip, Difficulty swallowing, Tooth/dental problems, visual complaints visual changes, ear ache CV:  chest pain, radiates: ,Orthopnea, PND, swelling in lower extremities, dizziness, palpitations, syncope.  GI  heartburn, indigestion, abdominal pain, nausea, vomiting, diarrhea, change in bowel habits, loss of appetite, bloody stools.  Resp: cough, productive: clear thick mucous , hemoptysis, dyspnea, chest pain- pleuritic with coughing only.  Skin: rash or itching or icterus GU: dysuria, change in color of urine, urgency or frequency. flank pain, hematuria  MS: joint pain or swelling. decreased range of motion  Psych: change in mood or affect. depression or anxiety.  Neuro: difficulty with speech, weakness, numbness, ataxia    SUBJECTIVE:   VITAL SIGNS: Temp:  [102.6 F (39.2 C)-104 F (40 C)] 102.6 F (39.2 C) (08/25 2331) Pulse Rate:  [101-108] 103 (08/25 2331) Resp:  [14-20]  20 (08/25 2331) BP: (88-123)/(37-58) 93/45 mmHg (08/25 2331) SpO2:  [97 %-100 %] 100 % (08/25 2331) Weight:  [58.968 kg (130 lb)] 58.968 kg (130 lb) (08/25 2005) HEMODYNAMICS:   VENTILATOR SETTINGS:   INTAKE / OUTPUT:  Intake/Output Summary (Last 24 hours) at 12/14/14 2333 Last data filed at 12/14/14 2235  Gross per 24 hour  Intake      0 ml  Output   4400 ml  Net  -4400 ml    PHYSICAL EXAMINATION: General:  54 year old female, chronically ill appearing Neuro:  Alert, oriented, non-focal HEENT:  [place]/AT, no JVD, PERRL Cardiovascular:  RRR, no MRG Lungs:  Diminished, mild tachypnea, unlabored Abdomen:  Distended, fluctuant, generalized tenderness. Umbilical hernia protruding. Paracentesis catheter left in place Musculoskeletal:  No acute deformity or ROM limitation.  Skin:  Grossly intact  LABS:  CBC  Recent Labs Lab 12/14/14 2035  WBC 12.6*  HGB 5.9*  HCT 18.4*  PLT 185   Coag's  Recent Labs Lab 12/14/14 2310  INR 1.79*   BMET  Recent Labs Lab 12/14/14 2035  NA 132*  K 3.8  CL 96*  CO2 29  BUN 9  CREATININE 0.77  GLUCOSE 103*   Electrolytes  Recent Labs Lab 12/14/14 2035  CALCIUM 7.9*   Sepsis Markers  Recent Labs Lab 12/14/14 2045 12/14/14 2316  LATICACIDVEN  2.74* 1.03   ABG No results for input(s): PHART, PCO2ART, PO2ART in the last 168 hours. Liver Enzymes  Recent Labs Lab 12/14/14 2035  AST 38  ALT 15  ALKPHOS 139*  BILITOT 2.4*  ALBUMIN 2.8*   Cardiac Enzymes No results for input(s): TROPONINI, PROBNP in the last 168 hours. Glucose No results for input(s): GLUCAP in the last 168 hours.  Imaging Dg Chest 2 View  12/14/2014   CLINICAL DATA:  Fever.  EXAM: CHEST  2 VIEW  COMPARISON:  10/28/2014  FINDINGS: The heart size and mediastinal contours are within normal limits. Both lungs are clear. The visualized skeletal structures are unremarkable.  IMPRESSION: No active cardiopulmonary disease.   Electronically Signed   By:  Signa Kell M.D.   On: 12/14/2014 20:08     ASSESSMENT / PLAN:  PULMONARY A: Acute on chronic hypoxic respiratory failure COPD on home O2 without acute exacerbation  P:   Supplemental O2 too keep SpO2 > 92% IS as tolerated Bronchodilators to nebulized CXR in AM  CARDIOVASCULAR A:  Shock hemorrhagic vs septic > improving with volume expansion.  Chronic CHF Elevated lactic > cleared H/o HTN  P:  Telemetry monitoring MAP goal > 24mm/Hg Currently getting NS bolus, albumin Transfuse - see Heme section below Continue crystalloid volume resuscitation as well Hold home anti-hypertensives May need CVL/pressors. Refusing CVL at this time, but would consider if needs pressors.  Troponin Lactic cleared in ED  RENAL A:   Hyponatremia Hypocalcemia  P:   Follow Bmet Replete Ca as transfusing  GASTROINTESTINAL A:   Alcoholic cirrhosis Suspect GI bleeding Ascites  Umbilical hernia Diarrhea  P:   NPO besides meds CT abd/pelvis to assess for incarceration of hernia Hold lactulose, rifaximin in setting diarrhea with likely GI bleed Hold diuretics with hypotension D/c paracentesis catheter after FFP Follow fluid studies Hemoccult  Consult GI in AM  HEMATOLOGIC A:   Acute blood loss anemia, suspect GI bleed Coagulopathy secondary to cirrhosis  P:  Serial CBC Transfuse 2 units PRBC Transfuse 1 unit FFP SCDs  INFECTIOUS A:   Possible SBP ? Severe sepsis ? Cdif - Recent ABX  P:   BCx2 8/25 >>> UC 8/25 >>> Sputum 8/25 >>> PCT Stool culture C-dif PCR  Abx: cefotaxime, start date 8/25 >>> Abx: zosyn, start date 8/25 >>> Abx: vancomycin, start date 8/25 >>> Abx: Flagyl start date 8/25 >>>   ENDOCRINE A:   No acute issues   P:   Monitor  NEUROLOGIC A:   No acute issues  P:   RASS goal: 0 Check ammonia with AM labs, holding lactulose and rifaximin  [place]   FAMILY  - Updates:   - Inter-disciplinary family meet or Palliative Care  meeting due by:  9/3   Joneen Roach, AGACNP-BC [place] [facility] Pager [phone] or [phone]  12/15/2014 12:52 AM

## 2014-12-14 NOTE — ED Notes (Signed)
Pt returned from X-ray.  

## 2014-12-15 ENCOUNTER — Inpatient Hospital Stay (HOSPITAL_COMMUNITY): Payer: Medicare Other

## 2014-12-15 ENCOUNTER — Encounter (HOSPITAL_COMMUNITY): Payer: Self-pay | Admitting: General Surgery

## 2014-12-15 DIAGNOSIS — I959 Hypotension, unspecified: Secondary | ICD-10-CM | POA: Diagnosis present

## 2014-12-15 DIAGNOSIS — K7031 Alcoholic cirrhosis of liver with ascites: Secondary | ICD-10-CM | POA: Diagnosis present

## 2014-12-15 DIAGNOSIS — A419 Sepsis, unspecified organism: Secondary | ICD-10-CM

## 2014-12-15 DIAGNOSIS — R1030 Lower abdominal pain, unspecified: Secondary | ICD-10-CM

## 2014-12-15 DIAGNOSIS — D689 Coagulation defect, unspecified: Secondary | ICD-10-CM

## 2014-12-15 DIAGNOSIS — R188 Other ascites: Secondary | ICD-10-CM

## 2014-12-15 DIAGNOSIS — K2921 Alcoholic gastritis with bleeding: Secondary | ICD-10-CM

## 2014-12-15 DIAGNOSIS — R195 Other fecal abnormalities: Secondary | ICD-10-CM

## 2014-12-15 DIAGNOSIS — D638 Anemia in other chronic diseases classified elsewhere: Secondary | ICD-10-CM

## 2014-12-15 DIAGNOSIS — D62 Acute posthemorrhagic anemia: Secondary | ICD-10-CM

## 2014-12-15 DIAGNOSIS — K922 Gastrointestinal hemorrhage, unspecified: Secondary | ICD-10-CM | POA: Diagnosis present

## 2014-12-15 DIAGNOSIS — I9581 Postprocedural hypotension: Secondary | ICD-10-CM

## 2014-12-15 DIAGNOSIS — K429 Umbilical hernia without obstruction or gangrene: Secondary | ICD-10-CM

## 2014-12-15 DIAGNOSIS — K469 Unspecified abdominal hernia without obstruction or gangrene: Secondary | ICD-10-CM | POA: Diagnosis present

## 2014-12-15 DIAGNOSIS — J449 Chronic obstructive pulmonary disease, unspecified: Secondary | ICD-10-CM | POA: Diagnosis present

## 2014-12-15 HISTORY — DX: Acute posthemorrhagic anemia: D62

## 2014-12-15 HISTORY — DX: Coagulation defect, unspecified: D68.9

## 2014-12-15 HISTORY — DX: Gastrointestinal hemorrhage, unspecified: K92.2

## 2014-12-15 LAB — CBC
HCT: 25.8 % — ABNORMAL LOW (ref 36.0–46.0)
HEMOGLOBIN: 8.9 g/dL — AB (ref 12.0–15.0)
MCH: 28 pg (ref 26.0–34.0)
MCHC: 34.5 g/dL (ref 30.0–36.0)
MCV: 81.1 fL (ref 78.0–100.0)
Platelets: 164 10*3/uL (ref 150–400)
RBC: 3.18 MIL/uL — AB (ref 3.87–5.11)
RDW: 16.4 % — ABNORMAL HIGH (ref 11.5–15.5)
WBC: 9.4 10*3/uL (ref 4.0–10.5)

## 2014-12-15 LAB — CBC WITH DIFFERENTIAL/PLATELET
BASOS ABS: 0.1 10*3/uL (ref 0.0–0.1)
Basophils Relative: 1 % (ref 0–1)
Eosinophils Absolute: 0 10*3/uL (ref 0.0–0.7)
Eosinophils Relative: 0 % (ref 0–5)
HEMATOCRIT: 26.9 % — AB (ref 36.0–46.0)
Hemoglobin: 8.9 g/dL — ABNORMAL LOW (ref 12.0–15.0)
LYMPHS ABS: 1.8 10*3/uL (ref 0.7–4.0)
Lymphocytes Relative: 21 % (ref 12–46)
MCH: 27.1 pg (ref 26.0–34.0)
MCHC: 33.1 g/dL (ref 30.0–36.0)
MCV: 82 fL (ref 78.0–100.0)
MONOS PCT: 10 % (ref 3–12)
Monocytes Absolute: 0.9 10*3/uL (ref 0.1–1.0)
NEUTROS ABS: 5.7 10*3/uL (ref 1.7–7.7)
Neutrophils Relative %: 68 % (ref 43–77)
Platelets: 149 10*3/uL — ABNORMAL LOW (ref 150–400)
RBC: 3.28 MIL/uL — ABNORMAL LOW (ref 3.87–5.11)
RDW: 16.1 % — AB (ref 11.5–15.5)
WBC: 8.5 10*3/uL (ref 4.0–10.5)

## 2014-12-15 LAB — MRSA PCR SCREENING: MRSA BY PCR: NEGATIVE

## 2014-12-15 LAB — COMPREHENSIVE METABOLIC PANEL
ALBUMIN: 3.1 g/dL — AB (ref 3.5–5.0)
ALK PHOS: 116 U/L (ref 38–126)
ALT: 12 U/L — AB (ref 14–54)
ALT: 13 U/L — ABNORMAL LOW (ref 14–54)
ANION GAP: 6 (ref 5–15)
AST: 31 U/L (ref 15–41)
AST: 33 U/L (ref 15–41)
Albumin: 2.8 g/dL — ABNORMAL LOW (ref 3.5–5.0)
Alkaline Phosphatase: 127 U/L — ABNORMAL HIGH (ref 38–126)
Anion gap: 8 (ref 5–15)
BILIRUBIN TOTAL: 2.9 mg/dL — AB (ref 0.3–1.2)
BUN: 9 mg/dL (ref 6–20)
BUN: 8 mg/dL (ref 6–20)
CALCIUM: 8.1 mg/dL — AB (ref 8.9–10.3)
CO2: 28 mmol/L (ref 22–32)
CO2: 29 mmol/L (ref 22–32)
CREATININE: 0.79 mg/dL (ref 0.44–1.00)
Calcium: 7.7 mg/dL — ABNORMAL LOW (ref 8.9–10.3)
Chloride: 96 mmol/L — ABNORMAL LOW (ref 101–111)
Chloride: 97 mmol/L — ABNORMAL LOW (ref 101–111)
Creatinine, Ser: 0.79 mg/dL (ref 0.44–1.00)
Glucose, Bld: 106 mg/dL — ABNORMAL HIGH (ref 65–99)
Glucose, Bld: 120 mg/dL — ABNORMAL HIGH (ref 65–99)
POTASSIUM: 3.2 mmol/L — AB (ref 3.5–5.1)
Potassium: 3.5 mmol/L (ref 3.5–5.1)
Sodium: 131 mmol/L — ABNORMAL LOW (ref 135–145)
Sodium: 133 mmol/L — ABNORMAL LOW (ref 135–145)
TOTAL PROTEIN: 6.8 g/dL (ref 6.5–8.1)
Total Bilirubin: 2.7 mg/dL — ABNORMAL HIGH (ref 0.3–1.2)
Total Protein: 6.3 g/dL — ABNORMAL LOW (ref 6.5–8.1)

## 2014-12-15 LAB — C DIFFICILE QUICK SCREEN W PCR REFLEX
C DIFFICILE (CDIFF) INTERP: NEGATIVE
C DIFFICILE (CDIFF) TOXIN: NEGATIVE
C Diff antigen: NEGATIVE

## 2014-12-15 LAB — PROTIME-INR
INR: 1.8 — AB (ref 0.00–1.49)
PROTHROMBIN TIME: 20.9 s — AB (ref 11.6–15.2)

## 2014-12-15 LAB — ABO/RH: ABO/RH(D): O POS

## 2014-12-15 LAB — BODY FLUID CELL COUNT WITH DIFFERENTIAL
Eos, Fluid: 0 %
Lymphs, Fluid: 91 %
MONOCYTE-MACROPHAGE-SEROUS FLUID: 6 % — AB (ref 50–90)
NEUTROPHIL FLUID: 3 % (ref 0–25)
WBC FLUID: 16 uL (ref 0–1000)

## 2014-12-15 LAB — GLUCOSE, CAPILLARY: Glucose-Capillary: 132 mg/dL — ABNORMAL HIGH (ref 65–99)

## 2014-12-15 LAB — LACTIC ACID, PLASMA
LACTIC ACID, VENOUS: 1.4 mmol/L (ref 0.5–2.0)
LACTIC ACID, VENOUS: 1.9 mmol/L (ref 0.5–2.0)

## 2014-12-15 LAB — TSH: TSH: 0.748 u[IU]/mL (ref 0.350–4.500)

## 2014-12-15 LAB — HEMOGLOBIN AND HEMATOCRIT, BLOOD
HCT: 24.9 % — ABNORMAL LOW (ref 36.0–46.0)
HEMATOCRIT: 22.6 % — AB (ref 36.0–46.0)
Hemoglobin: 8.6 g/dL — ABNORMAL LOW (ref 12.0–15.0)
Hemoglobin: 7.1 g/dL — ABNORMAL LOW (ref 12.0–15.0)

## 2014-12-15 LAB — PROCALCITONIN
PROCALCITONIN: 0.41 ng/mL
Procalcitonin: 0.46 ng/mL

## 2014-12-15 LAB — PREPARE RBC (CROSSMATCH)

## 2014-12-15 LAB — APTT: aPTT: 43 seconds — ABNORMAL HIGH (ref 24–37)

## 2014-12-15 LAB — OCCULT BLOOD X 1 CARD TO LAB, STOOL: Fecal Occult Bld: POSITIVE — AB

## 2014-12-15 LAB — POC OCCULT BLOOD, ED: Fecal Occult Bld: POSITIVE — AB

## 2014-12-15 LAB — AMMONIA: Ammonia: 28 umol/L (ref 9–35)

## 2014-12-15 MED ORDER — SODIUM CHLORIDE 0.9 % IV SOLN
50.0000 ug/h | INTRAVENOUS | Status: DC
  Administered 2014-12-15 – 2014-12-16 (×3): 50 ug/h via INTRAVENOUS
  Filled 2014-12-15 (×10): qty 1

## 2014-12-15 MED ORDER — LISINOPRIL 10 MG PO TABS: 10.0000 mg | ORAL_TABLET | Freq: Every day | ORAL | Status: DC

## 2014-12-15 MED ORDER — LORAZEPAM 2 MG/ML IJ SOLN: 1.0000 mg | Freq: Four times a day (QID) | INTRAMUSCULAR | Status: DC | PRN

## 2014-12-15 MED ORDER — IPRATROPIUM-ALBUTEROL 0.5-2.5 (3) MG/3ML IN SOLN
3.0000 mL | Freq: Four times a day (QID) | RESPIRATORY_TRACT | Status: DC
  Administered 2014-12-15 – 2014-12-16 (×5): 3 mL via RESPIRATORY_TRACT
  Filled 2014-12-15 (×6): qty 3

## 2014-12-15 MED ORDER — SODIUM CHLORIDE 0.9 % IJ SOLN: 3.0000 mL | Freq: Two times a day (BID) | INTRAMUSCULAR | Status: DC

## 2014-12-15 MED ORDER — METRONIDAZOLE IN NACL 5-0.79 MG/ML-% IV SOLN
500.0000 mg | Freq: Three times a day (TID) | INTRAVENOUS | Status: DC
  Administered 2014-12-15: 500 mg via INTRAVENOUS
  Filled 2014-12-15: qty 100

## 2014-12-15 MED ORDER — CARVEDILOL 3.125 MG PO TABS: 3.1250 mg | ORAL_TABLET | Freq: Two times a day (BID) | ORAL | Status: DC

## 2014-12-15 MED ORDER — SODIUM CHLORIDE 0.9 % IV SOLN: 250.0000 mL | INTRAVENOUS | Status: DC | PRN

## 2014-12-15 MED ORDER — SODIUM CHLORIDE 0.9 % IJ SOLN: 3.0000 mL | INTRAMUSCULAR | Status: DC | PRN

## 2014-12-15 MED ORDER — ONDANSETRON HCL 4 MG PO TABS: 4.0000 mg | ORAL_TABLET | Freq: Four times a day (QID) | ORAL | Status: DC | PRN

## 2014-12-15 MED ORDER — LORAZEPAM 1 MG PO TABS
1.0000 mg | ORAL_TABLET | Freq: Four times a day (QID) | ORAL | Status: DC | PRN
  Administered 2014-12-16: 1 mg via ORAL
  Filled 2014-12-15: qty 1

## 2014-12-15 MED ORDER — GUAIFENESIN 100 MG/5ML PO SYRP
100.0000 mg | ORAL_SOLUTION | ORAL | Status: DC | PRN
  Administered 2014-12-15 – 2014-12-16 (×2): 100 mg via ORAL
  Filled 2014-12-15 (×4): qty 5

## 2014-12-15 MED ORDER — SPIRONOLACTONE 25 MG PO TABS: 100.0000 mg | ORAL_TABLET | Freq: Every day | ORAL | Status: DC

## 2014-12-15 MED ORDER — METRONIDAZOLE IN NACL 5-0.79 MG/ML-% IV SOLN
500.0000 mg | Freq: Three times a day (TID) | INTRAVENOUS | Status: DC
  Administered 2014-12-15 – 2014-12-16 (×3): 500 mg via INTRAVENOUS
  Filled 2014-12-15 (×3): qty 100

## 2014-12-15 MED ORDER — VITAMIN B-1 100 MG PO TABS
100.0000 mg | ORAL_TABLET | Freq: Every day | ORAL | Status: DC
  Administered 2014-12-15 – 2014-12-21 (×7): 100 mg via ORAL
  Filled 2014-12-15 (×7): qty 1

## 2014-12-15 MED ORDER — TIOTROPIUM BROMIDE MONOHYDRATE 18 MCG IN CAPS
18.0000 ug | ORAL_CAPSULE | Freq: Every day | RESPIRATORY_TRACT | Status: DC
  Filled 2014-12-15: qty 5

## 2014-12-15 MED ORDER — PANTOPRAZOLE SODIUM 40 MG PO TBEC
40.0000 mg | DELAYED_RELEASE_TABLET | Freq: Every day | ORAL | Status: DC
  Administered 2014-12-15 – 2014-12-21 (×7): 40 mg via ORAL
  Filled 2014-12-15 (×7): qty 1

## 2014-12-15 MED ORDER — OCTREOTIDE LOAD VIA INFUSION
50.0000 ug | Freq: Once | INTRAVENOUS | Status: AC
  Administered 2014-12-15: 50 ug via INTRAVENOUS
  Filled 2014-12-15: qty 25

## 2014-12-15 MED ORDER — FOLIC ACID 1 MG PO TABS
1.0000 mg | ORAL_TABLET | Freq: Every day | ORAL | Status: DC
  Administered 2014-12-15 – 2014-12-21 (×7): 1 mg via ORAL
  Filled 2014-12-15 (×7): qty 1

## 2014-12-15 MED ORDER — SODIUM CHLORIDE 0.9 % IV BOLUS (SEPSIS)
500.0000 mL | Freq: Once | INTRAVENOUS | Status: AC
  Administered 2014-12-15: 500 mL via INTRAVENOUS

## 2014-12-15 MED ORDER — DEXTROSE 5 % IV SOLN
2.0000 g | INTRAVENOUS | Status: DC
  Administered 2014-12-15: 2 g via INTRAVENOUS
  Filled 2014-12-15 (×2): qty 2

## 2014-12-15 MED ORDER — FUROSEMIDE 20 MG PO TABS: 20.0000 mg | ORAL_TABLET | Freq: Every day | ORAL | Status: DC

## 2014-12-15 MED ORDER — ONDANSETRON HCL 4 MG/2ML IJ SOLN
4.0000 mg | Freq: Four times a day (QID) | INTRAMUSCULAR | Status: DC | PRN
  Administered 2014-12-15: 4 mg via INTRAVENOUS
  Filled 2014-12-15: qty 2

## 2014-12-15 MED ORDER — PIPERACILLIN-TAZOBACTAM 3.375 G IVPB
3.3750 g | Freq: Three times a day (TID) | INTRAVENOUS | Status: DC
  Administered 2014-12-15: 3.375 g via INTRAVENOUS
  Filled 2014-12-15: qty 50

## 2014-12-15 MED ORDER — MOMETASONE FURO-FORMOTEROL FUM 100-5 MCG/ACT IN AERO
2.0000 | INHALATION_SPRAY | Freq: Two times a day (BID) | RESPIRATORY_TRACT | Status: DC
  Filled 2014-12-15: qty 8.8

## 2014-12-15 MED ORDER — SODIUM CHLORIDE 0.9 % IV SOLN: Freq: Once | INTRAVENOUS | Status: DC

## 2014-12-15 MED ORDER — ADULT MULTIVITAMIN W/MINERALS CH
1.0000 | ORAL_TABLET | Freq: Every day | ORAL | Status: DC
  Administered 2014-12-15 – 2014-12-21 (×7): 1 via ORAL
  Filled 2014-12-15 (×7): qty 1

## 2014-12-15 MED ORDER — SODIUM CHLORIDE 0.9 % IJ SOLN
3.0000 mL | Freq: Two times a day (BID) | INTRAMUSCULAR | Status: DC
  Administered 2014-12-15 – 2014-12-20 (×9): 3 mL via INTRAVENOUS

## 2014-12-15 MED ORDER — LACTULOSE 10 GM/15ML PO SOLN: 10.0000 g | Freq: Every day | ORAL | Status: DC

## 2014-12-15 MED ORDER — RIFAXIMIN 550 MG PO TABS
550.0000 mg | ORAL_TABLET | Freq: Two times a day (BID) | ORAL | Status: DC
  Administered 2014-12-15 – 2014-12-21 (×14): 550 mg via ORAL
  Filled 2014-12-15 (×15): qty 1

## 2014-12-15 MED ORDER — SODIUM CHLORIDE 0.9 % IV SOLN
2.0000 g | Freq: Once | INTRAVENOUS | Status: AC
  Administered 2014-12-15: 2 g via INTRAVENOUS
  Filled 2014-12-15: qty 20

## 2014-12-15 MED ORDER — SODIUM CHLORIDE 0.9 % IV SOLN
INTRAVENOUS | Status: DC
  Administered 2014-12-15: 01:00:00 via INTRAVENOUS
  Administered 2014-12-16: 1000 mL via INTRAVENOUS
  Administered 2014-12-17: 19:00:00 via INTRAVENOUS

## 2014-12-15 MED ORDER — VANCOMYCIN HCL IN DEXTROSE 750-5 MG/150ML-% IV SOLN
750.0000 mg | Freq: Two times a day (BID) | INTRAVENOUS | Status: DC
  Administered 2014-12-15: 750 mg via INTRAVENOUS
  Filled 2014-12-15: qty 150

## 2014-12-15 MED ORDER — DOCUSATE SODIUM 100 MG PO CAPS: 100.0000 mg | ORAL_CAPSULE | Freq: Two times a day (BID) | ORAL | Status: DC

## 2014-12-15 MED ORDER — HYDROCORTISONE 1 % EX CREA
1.0000 "application " | TOPICAL_CREAM | Freq: Two times a day (BID) | CUTANEOUS | Status: DC
  Administered 2014-12-15 – 2014-12-21 (×13): 1 via TOPICAL
  Filled 2014-12-15 (×2): qty 28

## 2014-12-15 MED ORDER — ALBUTEROL SULFATE (2.5 MG/3ML) 0.083% IN NEBU
2.5000 mg | INHALATION_SOLUTION | RESPIRATORY_TRACT | Status: DC | PRN
  Administered 2014-12-15: 2.5 mg via RESPIRATORY_TRACT
  Filled 2014-12-15: qty 3

## 2014-12-15 MED ORDER — MOMETASONE FURO-FORMOTEROL FUM 100-5 MCG/ACT IN AERO: 2.0000 | INHALATION_SPRAY | Freq: Two times a day (BID) | RESPIRATORY_TRACT | Status: DC

## 2014-12-15 MED ORDER — POTASSIUM CHLORIDE CRYS ER 20 MEQ PO TBCR
40.0000 meq | EXTENDED_RELEASE_TABLET | ORAL | Status: AC
  Administered 2014-12-15 (×2): 40 meq via ORAL
  Filled 2014-12-15: qty 2

## 2014-12-15 MED ORDER — IOHEXOL 300 MG/ML  SOLN
25.0000 mL | INTRAMUSCULAR | Status: DC
  Administered 2014-12-15 (×2): 25 mL via ORAL

## 2014-12-15 NOTE — Progress Notes (Signed)
Clarified with MD about previous blood products ordered and not transfused. Made aware of current Hgb. Orders received.

## 2014-12-15 NOTE — Care Management Note (Signed)
Case Management Note  Patient Details  Name: Marie Brooks MRN: [id number] Date of Birth: 06/08/1960  Subjective/Objective:        Anemia, lower gi bleed, sepsis            Action/Plan:Date:  December 15, 2014 U.R. performed for needs and level of care. Will continue to follow for Case Management needs.  Marcelle Smiling, RN, BSN, [place]   [phone]   Expected Discharge Date:                  Expected Discharge Plan:  Home/Self Care  In-House Referral:  NA  Discharge planning Services  CM Consult  Post Acute Care Choice:  NA Choice offered to:  NA  DME Arranged:    DME Agency:     HH Arranged:    HH Agency:     Status of Service:  Completed, signed off  Medicare Important Message Given:    Date Medicare IM Given:    Medicare IM give by:    Date Additional Medicare IM Given:    Additional Medicare Important Message give by:     If discussed at Long Length of Stay Meetings, dates discussed:    Additional Comments:  Golda Acre, RN 12/15/2014, 10:33 AM

## 2014-12-15 NOTE — Progress Notes (Signed)
eLink Physician-Brief Progress Note Patient Name: Marie Brooks DOB: 04-11-1961 MRN: [id number]   Date of Service  12/15/2014  HPI/Events of Note  Hypokalemia  eICU Interventions  Potassium replaced     Intervention Category Intermediate Interventions: Electrolyte abnormality - evaluation and management  Jobina Maita 12/15/2014, 2:32 AM

## 2014-12-15 NOTE — Progress Notes (Signed)
Paged attending about blood pressures trending down, awaiting reply or orders, will continue to monitor. Morene Crocker, RN-BSN

## 2014-12-15 NOTE — Consult Note (Signed)
Reason for Consult: umbilical fat containing hernia Referring Physician: Dr. Scarlette Shorts    HPI: Marie Brooks is a 54 year old female with a history of EtOH abuse, COPD, CHF, HTN, cirrhosis who came to [facility] yesterday with abdominal pain, bloating and worsening dyspnea.  The patient states she was just discharged from [facility] for the same symptoms and apparently had a paracentesis.  She had another paracentesis yesterday which yielded 4L.  She then became hypotensive, but responded to IV fluids.  She was also found to have a hemoglobin of 5.5, fevers of 103, lactic acidosis.  She was admitted to the ICU for IV antibiotics.  Peritoneal cultures have not yielded any growth to date, UA was negative.  H&H have now stabilized with transfusions and GI is following.   A non contrast CT of abdomen and pelvis today showed a small fat containing hernia.  We have therefore been asked to evaluate.  The patient denies EtOH use since July 1st.   No leukocytosis. Lactic acid has normalized from 2.74 on admission. INR is 1.8.   Denies nausea, vomiting.  Having BMs and passing flatus.  Reports duration of hernia for several months now.    She is pleasant, but does not make eye contact.  Endorses to tobacco use 2-3 times per week.  On rocephin/flagyl  Past Medical History  Diagnosis Date  . COPD (chronic obstructive pulmonary disease)   . CHF (congestive heart failure)   . HTN (hypertension)   . Ascites   . Cirrhosis   . Tobacco abuse   . Alcohol abuse     Past Surgical History  Procedure Laterality Date  . Paracentesis N/A     Early July 2015.    Family History  Problem Relation Age of Onset  . Diabetes Brother     Social History:  reports that she has been smoking.  She does not have any smokeless tobacco history on file. She reports that she drinks alcohol. She reports that she does not use illicit drugs.  Allergies: No Known Allergies  Medications:  Scheduled  Meds: . sodium chloride   Intravenous Once  . cefTRIAXone (ROCEPHIN)  IV  2 g Intravenous Q24H  . folic acid  1 mg Oral Daily  . hydrocortisone cream  1 application Topical BID  . ipratropium-albuterol  3 mL Nebulization Q6H  . metronidazole  500 mg Intravenous Q8H  . multivitamin with minerals  1 tablet Oral Daily  . pantoprazole  40 mg Oral Daily  . rifaximin  550 mg Oral BID  . sodium chloride  3 mL Intravenous Q12H  . thiamine  100 mg Oral Daily   Continuous Infusions: . sodium chloride 75 mL/hr at 12/15/14 1403  . octreotide  (SANDOSTATIN)    IV infusion 50 mcg/hr (12/15/14 1201)   PRN Meds:.albuterol, LORazepam **OR** LORazepam, ondansetron **OR** ondansetron (ZOFRAN) IV   Results for orders placed or performed during the hospital encounter of 12/14/14 (from the past 48 hour(s))  Urinalysis, Routine w reflex microscopic (not at [facility])     Status: Abnormal   Collection Time: 12/14/14  7:52 PM  Result Value Ref Range   Color, Urine ORANGE (A) YELLOW    Comment: BIOCHEMICALS MAY BE AFFECTED BY COLOR   APPearance TURBID (A) CLEAR   Specific Gravity, Urine 1.016 1.005 - 1.030   pH 6.0 5.0 - 8.0   Glucose, UA NEGATIVE NEGATIVE mg/dL   Hgb urine dipstick LARGE (A) NEGATIVE   Bilirubin Urine SMALL (  A) NEGATIVE   Ketones, ur NEGATIVE NEGATIVE mg/dL   Protein, ur 100 (A) NEGATIVE mg/dL   Urobilinogen, UA 0.2 0.0 - 1.0 mg/dL   Nitrite POSITIVE (A) NEGATIVE   Leukocytes, UA LARGE (A) NEGATIVE  Urine culture     Status: None (Preliminary result)   Collection Time: 12/14/14  7:52 PM  Result Value Ref Range   Specimen Description URINE, CLEAN CATCH    Special Requests NONE    Culture      TOO YOUNG TO READ Performed at [facility]    Report Status PENDING   Urine microscopic-add on     Status: None   Collection Time: 12/14/14  7:52 PM  Result Value Ref Range   WBC, UA TOO NUMEROUS TO COUNT <3 WBC/hpf   Urine-Other FIELD OBSCURED BY WBC'S   Comprehensive metabolic  panel     Status: Abnormal   Collection Time: 12/14/14  8:35 PM  Result Value Ref Range   Sodium 132 (L) 135 - 145 mmol/L   Potassium 3.8 3.5 - 5.1 mmol/L   Chloride 96 (L) 101 - 111 mmol/L   CO2 29 22 - 32 mmol/L   Glucose, Bld 103 (H) 65 - 99 mg/dL   BUN 9 6 - 20 mg/dL   Creatinine, Ser 0.77 0.44 - 1.00 mg/dL   Calcium 7.9 (L) 8.9 - 10.3 mg/dL   Total Protein 6.9 6.5 - 8.1 g/dL   Albumin 2.8 (L) 3.5 - 5.0 g/dL   AST 38 15 - 41 U/L   ALT 15 14 - 54 U/L   Alkaline Phosphatase 139 (H) 38 - 126 U/L   Total Bilirubin 2.4 (H) 0.3 - 1.2 mg/dL   GFR calc non Af Amer >60 >60 mL/min   GFR calc Af Amer >60 >60 mL/min    Comment: (NOTE) The eGFR has been calculated using the CKD EPI equation. This calculation has not been validated in all clinical situations. eGFR's persistently <60 mL/min signify possible Chronic Kidney Disease.    Anion gap 7 5 - 15  CBC WITH DIFFERENTIAL     Status: Abnormal   Collection Time: 12/14/14  8:35 PM  Result Value Ref Range   WBC 12.6 (H) 4.0 - 10.5 K/uL   RBC 2.23 (L) 3.87 - 5.11 MIL/uL   Hemoglobin 5.9 (LL) 12.0 - 15.0 g/dL    Comment: CRITICAL RESULT CALLED TO, READ BACK BY AND VERIFIED WITH: REPEATED TO VERIFY F DUDLEY RN BY A MORRIS MLS @ 2055 8.25.16    HCT 18.4 (L) 36.0 - 46.0 %   MCV 82.5 78.0 - 100.0 fL   MCH 26.5 26.0 - 34.0 pg   MCHC 32.1 30.0 - 36.0 g/dL   RDW 16.1 (H) 11.5 - 15.5 %   Platelets 185 150 - 400 K/uL   Neutrophils Relative % 74 43 - 77 %   Lymphocytes Relative 17 12 - 46 %   Monocytes Relative 7 3 - 12 %   Eosinophils Relative 1 0 - 5 %   Basophils Relative 1 0 - 1 %   Neutro Abs 9.4 (H) 1.7 - 7.7 K/uL   Lymphs Abs 2.1 0.7 - 4.0 K/uL   Monocytes Absolute 0.9 0.1 - 1.0 K/uL   Eosinophils Absolute 0.1 0.0 - 0.7 K/uL   Basophils Absolute 0.1 0.0 - 0.1 K/uL   RBC Morphology TARGET CELLS     Comment: POLYCHROMASIA PRESENT  Blood Culture (routine x 2)     Status: None (Preliminary result)  Collection Time: 12/14/14  8:35  PM  Result Value Ref Range   Specimen Description BLOOD RIGHT HAND    Special Requests BOTTLES DRAWN AEROBIC ONLY 5ML    Culture      NO GROWTH < 24 HOURS Performed at [facility]    Report Status PENDING   I-Stat CG4 Lactic Acid, ED  (not at  [facility])     Status: Abnormal   Collection Time: 12/14/14  8:45 PM  Result Value Ref Range   Lactic Acid, Venous 2.74 (HH) 0.5 - 2.0 mmol/L  Lactate dehydrogenase, Peritoneal fluid     Status: Abnormal   Collection Time: 12/14/14  9:53 PM  Result Value Ref Range   LD, Fluid 52 (H) 3 - 23 U/L    Comment: (NOTE) Results should be evaluated in conjunction with serum values Performed at [facility]    Fluid Type-FLDH PERITONEAL CAVITY   Glucose, Peritoneal fluid     Status: None   Collection Time: 12/14/14  9:53 PM  Result Value Ref Range   Glucose, Peritoneal Fluid 122 mg/dL    Comment: NO NORMAL RANGE ESTABLISHED FOR THIS TEST Performed at [facility]   Protein, Peritoneal fluid     Status: None   Collection Time: 12/14/14  9:53 PM  Result Value Ref Range   Total protein, fluid <3.0 g/dL    Comment: (NOTE) No normal range established for this test Results should be evaluated in conjunction with serum values Performed at [facility]    Fluid Type-FTP PERITONEAL CAVITY   Albumin, Peritoneal fluid     Status: None   Collection Time: 12/14/14  9:53 PM  Result Value Ref Range   Albumin, Fluid <1.0 g/dL    Comment: (NOTE) No normal range established for this test Results should be evaluated in conjunction with serum values Performed at [place]   Gram stain     Status: None (Preliminary result)   Collection Time: 12/14/14  9:53 PM  Result Value Ref Range   Specimen Description FLUID    Special Requests NONE    Gram Stain      RARE WBC PRESENT,BOTH PMN AND MONONUCLEAR NO ORGANISMS SEEN Performed at [facility]    Report Status PENDING    Prepare RBC     Status: None   Collection Time: 12/14/14 11:09 PM  Result Value Ref Range   Order Confirmation ORDER PROCESSED BY BLOOD BANK   Protime-INR     Status: Abnormal   Collection Time: 12/14/14 11:10 PM  Result Value Ref Range   Prothrombin Time 20.7 (H) 11.6 - 15.2 seconds   INR 1.79 (H) 0.00 - 1.49  I-Stat CG4 Lactic Acid, ED  (not at  [facility])     Status: None   Collection Time: 12/14/14 11:16 PM  Result Value Ref Range   Lactic Acid, Venous 1.03 0.5 - 2.0 mmol/L  POC occult blood, ED RN will collect     Status: Abnormal   Collection Time: 12/15/14 12:14 AM  Result Value Ref Range   Fecal Occult Bld POSITIVE (A) NEGATIVE  MRSA PCR Screening     Status: None   Collection Time: 12/15/14 12:20 AM  Result Value Ref Range   MRSA by PCR NEGATIVE NEGATIVE    Comment:        The GeneXpert MRSA Assay (FDA approved for NASAL specimens only), is one component of a comprehensive MRSA colonization surveillance program.  It is not intended to diagnose MRSA infection nor to guide or monitor treatment for MRSA infections.   CBC with Differential     Status: Abnormal   Collection Time: 12/15/14 12:37 AM  Result Value Ref Range   WBC 8.5 4.0 - 10.5 K/uL   RBC 3.28 (L) 3.87 - 5.11 MIL/uL   Hemoglobin 8.9 (L) 12.0 - 15.0 g/dL    Comment: RESULT REPEATED AND VERIFIED DELTA CHECK NOTED    HCT 26.9 (L) 36.0 - 46.0 %   MCV 82.0 78.0 - 100.0 fL   MCH 27.1 26.0 - 34.0 pg   MCHC 33.1 30.0 - 36.0 g/dL   RDW 16.1 (H) 11.5 - 15.5 %   Platelets 149 (L) 150 - 400 K/uL   Neutrophils Relative % 68 43 - 77 %   Lymphocytes Relative 21 12 - 46 %   Monocytes Relative 10 3 - 12 %   Eosinophils Relative 0 0 - 5 %   Basophils Relative 1 0 - 1 %   Neutro Abs 5.7 1.7 - 7.7 K/uL   Lymphs Abs 1.8 0.7 - 4.0 K/uL   Monocytes Absolute 0.9 0.1 - 1.0 K/uL   Eosinophils Absolute 0.0 0.0 - 0.7 K/uL   Basophils Absolute 0.1 0.0 - 0.1 K/uL   RBC Morphology TARGET CELLS   Comprehensive metabolic panel      Status: Abnormal   Collection Time: 12/15/14 12:37 AM  Result Value Ref Range   Sodium 131 (L) 135 - 145 mmol/L   Potassium 3.2 (L) 3.5 - 5.1 mmol/L   Chloride 96 (L) 101 - 111 mmol/L   CO2 29 22 - 32 mmol/L   Glucose, Bld 106 (H) 65 - 99 mg/dL   BUN 9 6 - 20 mg/dL   Creatinine, Ser 0.79 0.44 - 1.00 mg/dL   Calcium 7.7 (L) 8.9 - 10.3 mg/dL   Total Protein 6.8 6.5 - 8.1 g/dL   Albumin 3.1 (L) 3.5 - 5.0 g/dL   AST 31 15 - 41 U/L   ALT 13 (L) 14 - 54 U/L   Alkaline Phosphatase 127 (H) 38 - 126 U/L   Total Bilirubin 2.9 (H) 0.3 - 1.2 mg/dL   GFR calc non Af Amer >60 >60 mL/min   GFR calc Af Amer >60 >60 mL/min    Comment: (NOTE) The eGFR has been calculated using the CKD EPI equation. This calculation has not been validated in all clinical situations. eGFR's persistently <60 mL/min signify possible Chronic Kidney Disease.    Anion gap 6 5 - 15  Procalcitonin     Status: None   Collection Time: 12/15/14 12:37 AM  Result Value Ref Range   Procalcitonin 0.41 ng/mL    Comment:        Interpretation: PCT (Procalcitonin) <= 0.5 ng/mL: Systemic infection (sepsis) is not likely. Local bacterial infection is possible. (NOTE)         ICU PCT Algorithm               Non ICU PCT Algorithm    ----------------------------     ------------------------------         PCT < 0.25 ng/mL                 PCT < 0.1 ng/mL     Stopping of antibiotics            Stopping of antibiotics       strongly encouraged.  strongly encouraged.    ----------------------------     ------------------------------       PCT level decrease by               PCT < 0.25 ng/mL       >= 80% from peak PCT       OR PCT 0.25 - 0.5 ng/mL          Stopping of antibiotics                                             encouraged.     Stopping of antibiotics           encouraged.    ----------------------------     ------------------------------       PCT level decrease by              PCT >= 0.25 ng/mL       <  80% from peak PCT        AND PCT >= 0.5 ng/mL            Continuin g antibiotics                                              encouraged.       Continuing antibiotics            encouraged.    ----------------------------     ------------------------------     PCT level increase compared          PCT > 0.5 ng/mL         with peak PCT AND          PCT >= 0.5 ng/mL             Escalation of antibiotics                                          strongly encouraged.      Escalation of antibiotics        strongly encouraged.   Lactic acid, plasma     Status: None   Collection Time: 12/15/14  1:17 AM  Result Value Ref Range   Lactic Acid, Venous 1.4 0.5 - 2.0 mmol/L  TSH     Status: None   Collection Time: 12/15/14  1:17 AM  Result Value Ref Range   TSH 0.748 0.350 - 4.500 uIU/mL  Prepare fresh frozen plasma     Status: None (Preliminary result)   Collection Time: 12/15/14  2:00 AM  Result Value Ref Range   Unit Number [id number]    Blood Component Type THAWED PLASMA    Unit division 00    Status of Unit ALLOCATED    Transfusion Status OK TO TRANSFUSE   CBC     Status: Abnormal   Collection Time: 12/15/14  3:57 AM  Result Value Ref Range   WBC 9.4 4.0 - 10.5 K/uL   RBC 3.18 (L) 3.87 - 5.11 MIL/uL   Hemoglobin 8.9 (L) 12.0 - 15.0 g/dL   HCT 25.8 (L) 36.0 - 46.0 %   MCV 81.1 78.0 - 100.0 fL   MCH 28.0 26.0 -  34.0 pg   MCHC 34.5 30.0 - 36.0 g/dL   RDW 16.4 (H) 11.5 - 15.5 %   Platelets 164 150 - 400 K/uL  Comprehensive metabolic panel     Status: Abnormal   Collection Time: 12/15/14  3:57 AM  Result Value Ref Range   Sodium 133 (L) 135 - 145 mmol/L   Potassium 3.5 3.5 - 5.1 mmol/L   Chloride 97 (L) 101 - 111 mmol/L   CO2 28 22 - 32 mmol/L   Glucose, Bld 120 (H) 65 - 99 mg/dL   BUN 8 6 - 20 mg/dL   Creatinine, Ser 0.79 0.44 - 1.00 mg/dL   Calcium 8.1 (L) 8.9 - 10.3 mg/dL   Total Protein 6.3 (L) 6.5 - 8.1 g/dL   Albumin 2.8 (L) 3.5 - 5.0 g/dL   AST 33 15 - 41 U/L   ALT  12 (L) 14 - 54 U/L   Alkaline Phosphatase 116 38 - 126 U/L   Total Bilirubin 2.7 (H) 0.3 - 1.2 mg/dL   GFR calc non Af Amer >60 >60 mL/min   GFR calc Af Amer >60 >60 mL/min    Comment: (NOTE) The eGFR has been calculated using the CKD EPI equation. This calculation has not been validated in all clinical situations. eGFR's persistently <60 mL/min signify possible Chronic Kidney Disease.    Anion gap 8 5 - 15  Protime-INR     Status: Abnormal   Collection Time: 12/15/14  3:57 AM  Result Value Ref Range   Prothrombin Time 20.9 (H) 11.6 - 15.2 seconds   INR 1.80 (H) 0.00 - 1.49  APTT     Status: Abnormal   Collection Time: 12/15/14  3:57 AM  Result Value Ref Range   aPTT 43 (H) 24 - 37 seconds    Comment:        IF BASELINE aPTT IS ELEVATED, SUGGEST PATIENT RISK ASSESSMENT BE USED TO DETERMINE APPROPRIATE ANTICOAGULANT THERAPY.   Procalcitonin - Baseline     Status: None   Collection Time: 12/15/14  3:57 AM  Result Value Ref Range   Procalcitonin 0.46 ng/mL    Comment:        Interpretation: PCT (Procalcitonin) <= 0.5 ng/mL: Systemic infection (sepsis) is not likely. Local bacterial infection is possible. (NOTE)         ICU PCT Algorithm               Non ICU PCT Algorithm    ----------------------------     ------------------------------         PCT < 0.25 ng/mL                 PCT < 0.1 ng/mL     Stopping of antibiotics            Stopping of antibiotics       strongly encouraged.               strongly encouraged.    ----------------------------     ------------------------------       PCT level decrease by               PCT < 0.25 ng/mL       >= 80% from peak PCT       OR PCT 0.25 - 0.5 ng/mL          Stopping of antibiotics  encouraged.     Stopping of antibiotics           encouraged.    ----------------------------     ------------------------------       PCT level decrease by              PCT >= 0.25 ng/mL       < 80%  from peak PCT        AND PCT >= 0.5 ng/mL            Continuin g antibiotics                                              encouraged.       Continuing antibiotics            encouraged.    ----------------------------     ------------------------------     PCT level increase compared          PCT > 0.5 ng/mL         with peak PCT AND          PCT >= 0.5 ng/mL             Escalation of antibiotics                                          strongly encouraged.      Escalation of antibiotics        strongly encouraged.   Lactic acid, plasma     Status: None   Collection Time: 12/15/14  4:03 AM  Result Value Ref Range   Lactic Acid, Venous 1.9 0.5 - 2.0 mmol/L  Ammonia     Status: None   Collection Time: 12/15/14  6:46 AM  Result Value Ref Range   Ammonia 28 9 - 35 umol/L  Type and screen     Status: None (Preliminary result)   Collection Time: 12/15/14  6:46 AM  Result Value Ref Range   ABO/RH(D) O POS    Antibody Screen NEG    Sample Expiration 12/18/2014    Unit Number [id number]    Blood Component Type RED CELLS,LR    Unit division 00    Status of Unit ALLOCATED    Transfusion Status OK TO TRANSFUSE    Crossmatch Result Compatible    Unit Number [id number]    Blood Component Type RED CELLS,LR    Unit division 00    Status of Unit ALLOCATED    Transfusion Status OK TO TRANSFUSE    Crossmatch Result Compatible   ABO/Rh     Status: None   Collection Time: 12/15/14  6:46 AM  Result Value Ref Range   ABO/RH(D) O POS   Hemoglobin and hematocrit, blood     Status: Abnormal   Collection Time: 12/15/14  6:46 AM  Result Value Ref Range   Hemoglobin 8.6 (L) 12.0 - 15.0 g/dL   HCT 24.9 (L) 36.0 - 46.0 %  Glucose, capillary     Status: Abnormal   Collection Time: 12/15/14  8:08 AM  Result Value Ref Range   Glucose-Capillary 132 (H) 65 - 99 mg/dL  Body fluid cell count with differential     Status: Abnormal   Collection Time: 12/15/14 10:46 AM  Result Value  Ref Range    Fluid Type-FCT Peritoneal    Color, Fluid YELLOW (A) YELLOW   Appearance, Fluid CLEAR CLEAR   WBC, Fluid 16 0 - 1000 cu mm   Neutrophil Count, Fluid 3 0 - 25 %   Lymphs, Fluid 91 %   Monocyte-Macrophage-Serous Fluid 6 (L) 50 - 90 %   Eos, Fluid 0 %   Other Cells, Fluid OTHER CELLS IDENTIFIED AS MESOTHELIAL CELLS %  Occult blood card to lab, stool RN will collect     Status: Abnormal   Collection Time: 12/15/14 11:06 AM  Result Value Ref Range   Fecal Occult Bld POSITIVE (A) NEGATIVE  C difficile quick scan w PCR reflex     Status: None   Collection Time: 12/15/14 11:06 AM  Result Value Ref Range   C Diff antigen NEGATIVE NEGATIVE   C Diff toxin NEGATIVE NEGATIVE   C Diff interpretation Negative for toxigenic C. difficile     Ct Abdomen Pelvis Wo Contrast  12/15/2014   CLINICAL DATA:  Umbilical hernia.  EXAM: CT ABDOMEN AND PELVIS WITHOUT CONTRAST  TECHNIQUE: Multidetector CT imaging of the abdomen and pelvis was performed following the standard protocol without IV contrast.  COMPARISON:  CT scan of October 24, 2013.  FINDINGS: Moderate degenerative disc disease is noted at L5-S1. Visualized lung bases appear normal.  Small solitary gallstone is noted. No focal abnormality seen in the liver or spleen on these unenhanced images. Pancreatic calcifications are noted consistent with chronic pancreatitis. Adrenal glands and kidneys appear normal. No hydronephrosis or renal obstruction is noted. Moderate ascites is noted. No colonic dilatation is noted. The appendix appears normal. Mildly dilated small bowel loops are noted centrally within the abdomen most consistent with ileus. No definite transition zone is noted. Urinary bladder appears normal. Uterus and ovaries are unremarkable. Small fat containing periumbilical hernia is noted. There is no evidence of bowel incarceration. Mild anasarca is noted. No significant adenopathy is noted. Atherosclerosis of abdominal aorta is noted without aneurysm  formation.  IMPRESSION: Moderate ascites is noted.  Atherosclerosis of abdominal aorta without aneurysm formation.  Pancreatic calcifications consistent with chronic pancreatitis.  Small solitary gallstone.  Mildly dilated small bowel loops are noted centrally in the abdomen most consistent with ileus. Continued radiographic follow-up is recommended.  Mild anasarca is noted.   Electronically Signed   By: Marijo Conception, M.D.   On: 12/15/2014 12:41   Dg Chest 2 View  12/14/2014   CLINICAL DATA:  Fever.  EXAM: CHEST  2 VIEW  COMPARISON:  10/28/2014  FINDINGS: The heart size and mediastinal contours are within normal limits. Both lungs are clear. The visualized skeletal structures are unremarkable.  IMPRESSION: No active cardiopulmonary disease.   Electronically Signed   By: Kerby Moors M.D.   On: 12/14/2014 20:08   Dg Chest Port 1 View  12/15/2014   CLINICAL DATA:  Hypoxia  EXAM: PORTABLE CHEST - 1 VIEW  COMPARISON:  Chest x-rays dated 12/14/2014 and 09/06/2014.  FINDINGS: Cardiomediastinal silhouette remains normal in size and configuration. Suspect minimal subsegmental atelectasis at each lung base. There is mild interstitial prominence bilaterally which is without significant change compared to multiple prior studies. Lungs otherwise clear. No confluent airspace opacity to suggest a developing pneumonia. No pleural effusion seen. No pneumothorax. Lung volumes are normal. No osseous abnormality seen.  IMPRESSION: Probable mild subsegmental atelectasis at each lung base.  Mild interstitial prominence bilaterally which I suspect is chronic but could indicate a mild interstitial  edema.  Otherwise unremarkable chest x-ray.  No evidence of pneumonia.   Electronically Signed   By: Franki Cabot M.D.   On: 12/15/2014 07:07    Review of Systems  All other systems reviewed and are negative.  Blood pressure 76/43, pulse 91, temperature 98.9 F (37.2 C), temperature source Oral, resp. rate 18, height _0   (1.702 m), weight 69.9 kg (154 lb 1.6 oz), SpO2 99 %. Physical Exam  Constitutional: She appears well-developed. No distress.  Cardiovascular: Normal rate, regular rhythm, normal heart sounds and intact distal pulses.  Exam reveals no gallop and no friction rub.   No murmur heard. Respiratory: Effort normal and breath sounds normal. No respiratory distress. She has no wheezes. She has no rales. She exhibits no tenderness.  GI:  +Bs, large amount of ascites, small umbilical  hernia which is reducible, but returns once pressure is removed.  There is no erythema and no drainage.    Skin: She is not diaphoretic.    Assessment/Plan: Fat containing hernia in a 54 year old patient with cirrhosis, acute on chronic blood loss anemia, tobacco use, alcohol abuse and coagulopathy.  The patient still has large amount of ascites despite paracentesis yesterday which yielded 4L. Controlling the ascites would help the hernia.  There is no incarcerated bowel, , obstruction or  erythema and therefore no need for urgent surgery.  Furthermore, ascites is a contraindication to hernia repair along with coagulopathy and cirrhosis.  Thank you for the consult.  Please call for further assistance.   Ronda Kazmi ANP-BC 12/15/2014, 3:46 PM

## 2014-12-15 NOTE — Progress Notes (Addendum)
Progress Note   Marie Brooks IEP:[id number] DOB: Jun 29, 1960 DOA: 12/14/2014 PCP: Andrena Mews, NP   Brief Narrative:   Marie Brooks is an 54 y.o. female with a PMH of cirrhosis, hypertension, COPD, CHF (EF 55-60 percent by cath done 10/31/13) and ongoing alcohol abuse who was admitted 12/14/14 with a chief complaint of worsening dyspnea and abdominal bloating. A 4 L paracentesis was performed in the ED, after which she became hypotensive. She was also noted to have a hemoglobin of 5.5 mg/dL (recently evaluated in the ED for melena) and a temperature of 103F.  Assessment/Plan:   Principal Problem:   Sepsis secondary to SBP (spontaneous bacterial peritonitis) with septic versus hemorrhagic shock - Sepsis criteria met with lactic acidosis fever, tachycardia, tachypnea and hypotension with source of infection: SBP - Chest x-ray was clear. - Blood pressure responded to fluid bolus challenge and administration of blood. Currently hemodynamically stable. - Pancultured (blood, urine, sputum, stool culture sent as well as C. difficile PCR). - Critical care team consulted and assisting with management. - Current antibiotics: Cefotaxime, Zosyn, vancomycin and Flagyl.  Narrow to Rocephin.  Active Problems:   Acute blood loss anemia/symptomatic anemia secondary to GI bleeding/melena - Nothing by mouth. - Hemoglobin stable after 2 units of PRBCs and 1 unit of FFP overnight. - GI consult requested. - 2 stools recorded overnight.  + Melena.      History of HTN (hypertension), hypotensive on admission / ? H/O CHF - Blood pressure stabilized after administration of albumen and fluid challenge. - Antihypertensives including Coreg, Lasix, lisinopril and spironolactone on hold. - The patient had no evidence of systolic or diastolic dysfunction on 2-D echo done 10/31/13. - Unclear where the diagnosis of CHF was made, suspect she has pulmonary edema related to low albumen state.    Cirrhosis with  Ascites / coagulopathy secondary to cirrhosis in the setting of alcohol abuse - Received 1 unit of FFP 12/14/14. - Ammonia not elevated. Lactulose on hold.    Alcohol abuse - No reported alcohol intake since 10/20/14. - Ativan ordered as needed for withdrawal symptoms. - Continue Folvite and thiamine.    COPD / acute on chronic hypoxic respiratory failure - Continue supplemental oxygen and broncho-dilators.    Hyponatremia - Mild. Lasix on hold. Gently hydrating.    Hypocalcemia - Improving status post 2 g of calcium gluconate.    DVT Prophylaxis - SCDs only in the setting of probable GI bleeding.  Family Communication: No family at the bedside. Disposition Plan: Home when stable. Code Status:     Code Status Orders        Start     Ordered   12/15/14 0037  Full code   Continuous     12/15/14 0036        IV Access:    Right IJ   Procedures and diagnostic studies:   Dg Chest 2 View  12/14/2014   CLINICAL DATA:  Fever.  EXAM: CHEST  2 VIEW  COMPARISON:  10/28/2014  FINDINGS: The heart size and mediastinal contours are within normal limits. Both lungs are clear. The visualized skeletal structures are unremarkable.  IMPRESSION: No active cardiopulmonary disease.   Electronically Signed   By: Kerby Moors M.D.   On: 12/14/2014 20:08   Dg Chest Port 1 View  12/15/2014   CLINICAL DATA:  Hypoxia  EXAM: PORTABLE CHEST - 1 VIEW  COMPARISON:  Chest x-rays dated 12/14/2014 and 09/06/2014.  FINDINGS: Cardiomediastinal silhouette remains normal in size  and configuration. Suspect minimal subsegmental atelectasis at each lung base. There is mild interstitial prominence bilaterally which is without significant change compared to multiple prior studies. Lungs otherwise clear. No confluent airspace opacity to suggest a developing pneumonia. No pleural effusion seen. No pneumothorax. Lung volumes are normal. No osseous abnormality seen.  IMPRESSION: Probable mild subsegmental atelectasis  at each lung base.  Mild interstitial prominence bilaterally which I suspect is chronic but could indicate a mild interstitial edema.  Otherwise unremarkable chest x-ray.  No evidence of pneumonia.   Electronically Signed   By: Franki Cabot M.D.   On: 12/15/2014 07:07     Medical Consultants:    Pulmonology/critical care: Corey Harold, NP  Anti-Infectives:    Cefotaxime, start date 8/25 >>>8/26  Zosyn, start date 8/25 >>>8/26  Vancomycin, start date 8/25 >>>8/26  Flagyl start date 8/25 >>>8/26  Rocephin 8/26 >>>  Subjective:   [name] has been febrile overnight.  Reports some abdominal and back pain, as well as ankle pain and some dyspnea.  Objective:    Filed Vitals:   12/15/14 0400 12/15/14 0409 12/15/14 0500 12/15/14 0600  BP: 129/70  111/63 101/62  Pulse: 110  109 122  Temp:  101.4 F (38.6 C)    TempSrc:  Oral    Resp: 21  22 23   Height:      Weight:  69.9 kg (154 lb 1.6 oz)    SpO2: 100%  100% 100%    Intake/Output Summary (Last 24 hours) at 12/15/14 6979 Last data filed at 12/15/14 0300  Gross per 24 hour  Intake    150 ml  Output   4400 ml  Net  -4250 ml    Exam: Gen:  NAD, lethargic Cardiovascular:  Tachy, No M/R/G Respiratory:  Lungs diminished with occasional faint wheeze Gastrointestinal:  Abdomen distended, tender with umbilical hernia and peritoneal catheter in place Extremities:  1+ edema   Data Reviewed:    Labs: Basic Metabolic Panel:  Recent Labs Lab 12/14/14 2035 12/15/14 0037 12/15/14 0357  NA 132* 131* 133*  K 3.8 3.2* 3.5  CL 96* 96* 97*  CO2 29 29 28   GLUCOSE 103* 106* 120*  BUN 9 9 8   CREATININE 0.77 0.79 0.79  CALCIUM 7.9* 7.7* 8.1*   GFR Estimated Creatinine Clearance: 78.2 mL/min (by C-G formula based on Cr of 0.79). Liver Function Tests:  Recent Labs Lab 12/14/14 2035 12/15/14 0037 12/15/14 0357  AST 38 31 33  ALT 15 13* 12*  ALKPHOS 139* 127* 116  BILITOT 2.4* 2.9* 2.7*  PROT 6.9 6.8 6.3*    ALBUMIN 2.8* 3.1* 2.8*    Recent Labs Lab 12/15/14 0646  AMMONIA 28   Coagulation profile  Recent Labs Lab 12/14/14 2310 12/15/14 0357  INR 1.79* 1.80*    CBC:  Recent Labs Lab 12/14/14 2035 12/15/14 0037 12/15/14 0357  WBC 12.6* 8.5 9.4  NEUTROABS 9.4* 5.7  --   HGB 5.9* 8.9* 8.9*  HCT 18.4* 26.9* 25.8*  MCV 82.5 82.0 81.1  PLT 185 149* 164   Thyroid function studies:  Recent Labs  12/15/14 0117  TSH 0.748   Sepsis Labs:  Recent Labs Lab 12/14/14 2035 12/14/14 2045 12/14/14 2316 12/15/14 0037 12/15/14 0117 12/15/14 0357 12/15/14 0403  PROCALCITON  --   --   --  0.41  --  0.46  --   WBC 12.6*  --   --  8.5  --  9.4  --   LATICACIDVEN  --  2.74* 1.03  --  1.4  --  1.9   Microbiology Recent Results (from the past 240 hour(s))  Gram stain     Status: None (Preliminary result)   Collection Time: 12/14/14  9:53 PM  Result Value Ref Range Status   Specimen Description FLUID  Final   Special Requests NONE  Final   Gram Stain   Final    RARE WBC PRESENT,BOTH PMN AND MONONUCLEAR NO ORGANISMS SEEN Performed at [facility]    Report Status PENDING  Incomplete  MRSA PCR Screening     Status: None   Collection Time: 12/15/14 12:20 AM  Result Value Ref Range Status   MRSA by PCR NEGATIVE NEGATIVE Final    Comment:        The GeneXpert MRSA Assay (FDA approved for NASAL specimens only), is one component of a comprehensive MRSA colonization surveillance program. It is not intended to diagnose MRSA infection nor to guide or monitor treatment for MRSA infections.      Medications:   . sodium chloride   Intravenous Once  . folic acid  1 mg Oral Daily  . hydrocortisone cream  1 application Topical BID  . ipratropium-albuterol  3 mL Nebulization Q6H  . lidocaine (PF)      . lidocaine      . metronidazole  500 mg Intravenous 3 times per day  . multivitamin with minerals  1 tablet Oral Daily  . pantoprazole  40 mg Oral Daily  .  piperacillin-tazobactam (ZOSYN)  IV  3.375 g Intravenous Q8H  . rifaximin  550 mg Oral BID  . sodium chloride  3 mL Intravenous Q12H  . thiamine  100 mg Oral Daily  . vancomycin  750 mg Intravenous Q12H   Continuous Infusions: . sodium chloride 75 mL/hr at 12/15/14 0126    Time spent: 35 minutes.  The patient is medically complex and requires high complexity decision making.    LOS: 1 day   Altair Appenzeller  [facility] Pager [phone]. If unable to reach me by pager, please call my cell phone at [phone].  *Please refer to [email or web address], password [email or web address] to get updated schedule on who will round on this patient, as hospitalists switch teams weekly. If 7PM-7AM, please contact night-coverage at [email or web address], password [email or web address] for any overnight needs.  12/15/2014, 7:27 AM

## 2014-12-15 NOTE — Consult Note (Signed)
Referring Provider: No ref. provider found Primary Care Physician:  Andrena Mews, NP Primary Gastroenterologist:  Althia Forts; GI in [place]  Reason for Consultation:  Melena; cirrhosis; anemia  HPI: Marie Brooks is a 54 y.o. female with a PMH of cirrhosis, hypertension, COPD, CHF (EF 55-60 percent by cath done 10/31/13), and ongoing alcohol abuse who was admitted 12/14/14 with a chief complaint of worsening dyspnea and abdominal bloating/swelling. A 4 L paracentesis was performed in the ED, after which she became hypotensive, but responded to fluid challenge; did not require pressors. Met sepsis criteria with lactic acidosis on admission, tachycardia, fever, and hypotension.  She was also noted to have a hemoglobin of 5.5 mg/dL (did not receive PRBC's and repeat x 2 shows Hgb in the 8's).  Reported black stools as outpatient.  Is having diarrhea.  Cdiff negative.  Hemoccult positive.  Has abdominal pain diffusely but mostly around large umbilical hernia.  Fluid studies not sent initially for cell count from paracentesis, but she was started on several antibiotics, which were later then narrowed to Rocephin.  Later ascitic fluid was sent for cell count and was negative for SBP.  INR is 1.8.  Apparently saw a GI MD in [place] earlier this month for the first time but was sent to [facility] for paracentesis due to ascites.  She is on Lasix and spironolactone as an outpatient as well as lactulose and Xifaxan.  Also protonix 40 mg daily.    Apparently last ETOH use was 10/20/2014.    Past Medical History  Diagnosis Date  . COPD (chronic obstructive pulmonary disease)   . CHF (congestive heart failure)   . HTN (hypertension)   . Ascites   . Cirrhosis   . Tobacco abuse   . Alcohol abuse     Past Surgical History  Procedure Laterality Date  . Paracentesis N/A     Early July 2015.    Prior to Admission medications   Medication Sig Start Date End Date Taking? Authorizing Provider  albuterol  (PROVENTIL) (2.5 MG/3ML) 0.083% nebulizer solution Inhale 2.5 mg into the lungs every 4 (four) hours as needed. 12/03/14 12/03/15 Yes Historical Provider, MD  carvedilol (COREG) 3.125 MG tablet Take 3.125 mg by mouth 2 (two) times daily with a meal.   Yes Historical Provider, MD  Fluticasone-Salmeterol (ADVAIR) 250-50 MCG/DOSE AEPB Inhale 1 puff into the lungs 2 (two) times daily.   Yes Historical Provider, MD  furosemide (LASIX) 20 MG tablet Take 20 mg by mouth daily.   Yes Historical Provider, MD  hydrocortisone 2.5 % cream Apply 1 application topically 2 (two) times daily. 12/03/14 12/03/15 Yes Historical Provider, MD  lactulose (CHRONULAC) 10 GM/15ML solution Take 15 mLs by mouth daily. 12/03/14  Yes Historical Provider, MD  lisinopril (PRINIVIL,ZESTRIL) 10 MG tablet Take 10 mg by mouth daily.   Yes Historical Provider, MD  nicotine (NICODERM CQ - DOSED IN MG/24 HOURS) 21 mg/24hr patch Place 1 patch (21 mg total) onto the skin daily. 11/02/13  Yes Nishant Dhungel, MD  pantoprazole (PROTONIX) 40 MG tablet Take 40 mg by mouth daily.   Yes Historical Provider, MD  potassium chloride SA (K-DUR,KLOR-CON) 20 MEQ tablet Take 20 mEq by mouth daily.   Yes Historical Provider, MD  rifaximin (XIFAXAN) 550 MG TABS tablet Take 550 mg by mouth 2 (two) times daily. 12/03/14  Yes Historical Provider, MD  spironolactone (ALDACTONE) 100 MG tablet Take 100 mg by mouth daily.   Yes Historical Provider, MD  SYMBICORT 160-4.5 MCG/ACT  inhaler Inhale 2 puffs into the lungs 2 (two) times daily. 11/23/14  Yes Historical Provider, MD  tiotropium (SPIRIVA) 18 MCG inhalation capsule Place 18 mcg into inhaler and inhale daily.   Yes Historical Provider, MD  albuterol (PROVENTIL HFA;VENTOLIN HFA) 108 (90 BASE) MCG/ACT inhaler Inhale 2 puffs into the lungs every 6 (six) hours as needed for wheezing or shortness of breath. 11/02/13   Nishant Dhungel, MD  guaiFENesin (MUCINEX) 600 MG 12 hr tablet Take 1 tablet (600 mg total) by mouth 2  (two) times daily. Patient not taking: Reported on 12/14/2014 11/02/13   Nishant Dhungel, MD  levofloxacin (LEVAQUIN) 750 MG tablet Take 1 tablet (750 mg total) by mouth daily. Patient not taking: Reported on 12/14/2014 11/02/13   Nishant Dhungel, MD  predniSONE (DELTASONE) 20 MG tablet Take 1 tablet (20 mg total) by mouth daily with breakfast. Patient not taking: Reported on 12/14/2014 11/02/13   Louellen Molder, MD    Current Facility-Administered Medications  Medication Dose Route Frequency Provider Last Rate Last Dose  . 0.9 %  sodium chloride infusion   Intravenous Continuous Corey Harold, NP 75 mL/hr at 12/15/14 0126    . 0.9 %  sodium chloride infusion   Intravenous Once Corey Harold, NP      . albuterol (PROVENTIL) (2.5 MG/3ML) 0.083% nebulizer solution 2.5 mg  2.5 mg Nebulization Q4H PRN Allyne Gee, MD   2.5 mg at 12/15/14 0641  . cefTRIAXone (ROCEPHIN) 2 g in dextrose 5 % 50 mL IVPB  2 g Intravenous Q24H Christina P Rama, MD      . folic acid (FOLVITE) tablet 1 mg  1 mg Oral Daily Allyne Gee, MD      . hydrocortisone cream 1 % 1 application  1 application Topical BID Allyne Gee, MD   1 application at 68/11/57 0130  . ipratropium-albuterol (DUONEB) 0.5-2.5 (3) MG/3ML nebulizer solution 3 mL  3 mL Nebulization Q6H Corey Harold, NP   3 mL at 12/15/14 0810  . LORazepam (ATIVAN) tablet 1 mg  1 mg Oral Q6H PRN Allyne Gee, MD       Or  . LORazepam (ATIVAN) injection 1 mg  1 mg Intravenous Q6H PRN Allyne Gee, MD      . multivitamin with minerals tablet 1 tablet  1 tablet Oral Daily Allyne Gee, MD      . octreotide (SANDOSTATIN) 2 mcg/mL load via infusion 50 mcg  50 mcg Intravenous Once Jessica D Zehr, PA-C       And  . octreotide (SANDOSTATIN) 500 mcg in sodium chloride 0.9 % 250 mL (2 mcg/mL) infusion  50 mcg/hr Intravenous Continuous Jessica D Zehr, PA-C      . ondansetron (ZOFRAN) tablet 4 mg  4 mg Oral Q6H PRN Allyne Gee, MD       Or  . ondansetron (ZOFRAN)  injection 4 mg  4 mg Intravenous Q6H PRN Allyne Gee, MD      . pantoprazole (PROTONIX) EC tablet 40 mg  40 mg Oral Daily Allyne Gee, MD      . rifaximin Doreene Nest) tablet 550 mg  550 mg Oral BID Allyne Gee, MD   550 mg at 12/15/14 0129  . sodium chloride 0.9 % injection 3 mL  3 mL Intravenous Q12H Allyne Gee, MD   3 mL at 12/15/14 0045  . thiamine (VITAMIN B-1) tablet 100 mg  100 mg Oral Daily Allyne Gee, MD  Allergies as of 12/14/2014  . (No Known Allergies)    Family History  Problem Relation Age of Onset  . Diabetes Brother     Social History   Social History  . Marital Status: Single    Spouse Name: N/A  . Number of Children: N/A  . Years of Education: N/A   Occupational History  . Not on file.   Social History Main Topics  . Smoking status: Current Every Day Smoker -- 0.25 packs/day for 30 years  . Smokeless tobacco: Not on file  . Alcohol Use: Yes  . Drug Use: No  . Sexual Activity: Not on file   Other Topics Concern  . Not on file   Social History Narrative    Review of Systems: Ten point ROS is O/W negative except as mentioned in HPI.  Physical Exam: Vital signs in last 24 hours: Temp:  [99.8 F (37.7 C)-104 F (40 C)] 99.8 F (37.7 C) (08/26 0800) Pulse Rate:  [101-122] 110 (08/26 0810) Resp:  [14-23] 18 (08/26 0810) BP: (88-131)/(37-70) 92/48 mmHg (08/26 0810) SpO2:  [97 %-100 %] 98 % (08/26 0810) Weight:  [130 lb (58.968 kg)-154 lb 1.6 oz (69.9 kg)] 154 lb 1.6 oz (69.9 kg) (08/26 0409) Last BM Date: 12/15/14 General:   Alert, chronically ill-appearing, in NAD. Head:  Normocephalic and atraumatic. Eyes:  Sclera clear, no icterus.  Conjunctiva pink. Ears:  Normal auditory acuity. Mouth:  No deformity or lesions.   Lungs:  Clear throughout to auscultation.  No wheezes, crackles, or rhonchi.  Heart:  Tachy. Abdomen:  Distended.  BS present. Diffuse TTP but greatest around the umbilicus where there is a large tender umbilical  hernia. Rectal:  Trace light brown stool, but was heme positive.  Msk:  Symmetrical without gross deformities. Pulses:  Normal pulses noted. Extremities:  Without clubbing or edema. Neurologic:  Alert and  oriented x4;  grossly normal neurologically. Skin:  Intact without significant lesions or rashes. Psych:  Alert and cooperative. Normal mood and affect.  Intake/Output from previous day: 08/25 0701 - 08/26 0700 In: 150 [IV Piggyback:150] Out: 4400   Lab Results:  Recent Labs  12/14/14 2035 12/15/14 0037 12/15/14 0357  WBC 12.6* 8.5 9.4  HGB 5.9* 8.9* 8.9*  HCT 18.4* 26.9* 25.8*  PLT 185 149* 164   BMET  Recent Labs  12/14/14 2035 12/15/14 0037 12/15/14 0357  NA 132* 131* 133*  K 3.8 3.2* 3.5  CL 96* 96* 97*  CO2 29 29 28   GLUCOSE 103* 106* 120*  BUN 9 9 8   CREATININE 0.77 0.79 0.79  CALCIUM 7.9* 7.7* 8.1*   LFT  Recent Labs  12/15/14 0357  PROT 6.3*  ALBUMIN 2.8*  AST 33  ALT 12*  ALKPHOS 116  BILITOT 2.7*   PT/INR  Recent Labs  12/14/14 2310 12/15/14 0357  LABPROT 20.7* 20.9*  INR 1.79* 1.80*   Studies/Results: Dg Chest 2 View  12/14/2014   CLINICAL DATA:  Fever.  EXAM: CHEST  2 VIEW  COMPARISON:  10/28/2014  FINDINGS: The heart size and mediastinal contours are within normal limits. Both lungs are clear. The visualized skeletal structures are unremarkable.  IMPRESSION: No active cardiopulmonary disease.   Electronically Signed   By: Kerby Moors M.D.   On: 12/14/2014 20:08   Dg Chest Port 1 View  12/15/2014   CLINICAL DATA:  Hypoxia  EXAM: PORTABLE CHEST - 1 VIEW  COMPARISON:  Chest x-rays dated 12/14/2014 and 09/06/2014.  FINDINGS: Cardiomediastinal silhouette remains  normal in size and configuration. Suspect minimal subsegmental atelectasis at each lung base. There is mild interstitial prominence bilaterally which is without significant change compared to multiple prior studies. Lungs otherwise clear. No confluent airspace opacity to suggest  a developing pneumonia. No pleural effusion seen. No pneumothorax. Lung volumes are normal. No osseous abnormality seen.  IMPRESSION: Probable mild subsegmental atelectasis at each lung base.  Mild interstitial prominence bilaterally which I suspect is chronic but could indicate a mild interstitial edema.  Otherwise unremarkable chest x-ray.  No evidence of pneumonia.   Electronically Signed   By: Franki Cabot M.D.   On: 12/15/2014 07:07    IMPRESSION:  *Sepsis ? secondary to SBP (spontaneous bacterial peritonitis):  Meets sepsis criteria with lactic acidosis, fever, tachycardia, hypotension.  Paracentesis performed in ED, but studies not sent for cell count.  Antibiotics were started and fluid studies sent finally this AM, cell count negative for SBP.  On Rocephin. *Anemia:  Last Hgb for comparison was one year ago.  Had reported black stools and are heme positive, but trace light brown stool on my exam today.  Hgb is stable.  Has not received PRBC's (Hgb 5.9 grams was likely erroneous).  *Cirrhosis with Ascites/mild coagulopathy in the setting of alcohol abuse  *Alcohol abuse:  No reported alcohol intake since 10/20/14. *Large tender umbilical hernia:  CT scan pending.  May need surgery involvement. *Diarrhea:  Cdiff negative.  Was on lactulose and xifaxan at home.  PLAN: -Supportive care.  Monitor Hgb and for signs of active bleeding. -Started octreotide empirically, which could be continued for 72 hours. -Await CT scan results.  ZEHR, JESSICA D.  12/15/2014, 10:07 AM  Pager number [phone]  GI ATTENDING  History, laboratories, x-rays reviewed. Patient personally seen and examined. 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 She has recently been managed by a gastroenterologist in [facility]. Presents the emergency room with increasing abdominal girth and  abdominal discomfort. Paracentesis performed. Admitted with decreased blood pressure. Initial hemoglobin low but repeated checks suggest that this is spurious. There were concerns about GI bleeding, however her stool is brown and heme occult positive. Physical exam is most notable for a large quite tender umbilical hernia. CT scan does not suggest incarceration though there is some small bowel dilation. The scan was not contrast-enhanced. Empirically on octreotide. No evidence for SBP. The principal issue at this point is management of her ascites and addressing the tender umbilical hernia. I would ask surgery to see her for their opinion. No urgent need for EGD. May need screening EGD to see if she has varices (if not already done by [facility] gastroenterologist). We will follow. Thank you  Docia Chuck. Geri Seminole., M.D. [facility] Division of Gastroenterology

## 2014-12-15 NOTE — Clinical Documentation Improvement (Signed)
Internal Medicine  Please clarify if CHF is present this admission and if so, please document the acuity (chronic, acute, acute on chronic) and type (diastolic, systolic, combined diastolic and systolic) in your future progress notes and discharge summary.     Supporting Information: Past Medical History grid in the H&P notes history of CHF and home meds of lasix and spironolactone; both ordered this hospitalization.    Please exercise your independent, professional judgment when responding. A specific answer is not anticipated or expected.  Thank you, Doy Mince, RN [phone] Clinical Documentation Specialist

## 2014-12-16 ENCOUNTER — Encounter (HOSPITAL_COMMUNITY): Payer: Self-pay | Admitting: Internal Medicine

## 2014-12-16 DIAGNOSIS — A4151 Sepsis due to Escherichia coli [E. coli]: Principal | ICD-10-CM

## 2014-12-16 DIAGNOSIS — R739 Hyperglycemia, unspecified: Secondary | ICD-10-CM | POA: Diagnosis present

## 2014-12-16 DIAGNOSIS — J438 Other emphysema: Secondary | ICD-10-CM

## 2014-12-16 DIAGNOSIS — F1010 Alcohol abuse, uncomplicated: Secondary | ICD-10-CM

## 2014-12-16 DIAGNOSIS — N390 Urinary tract infection, site not specified: Secondary | ICD-10-CM

## 2014-12-16 DIAGNOSIS — B9620 Unspecified Escherichia coli [E. coli] as the cause of diseases classified elsewhere: Secondary | ICD-10-CM | POA: Diagnosis present

## 2014-12-16 DIAGNOSIS — K861 Other chronic pancreatitis: Secondary | ICD-10-CM

## 2014-12-16 DIAGNOSIS — K860 Alcohol-induced chronic pancreatitis: Secondary | ICD-10-CM

## 2014-12-16 DIAGNOSIS — D696 Thrombocytopenia, unspecified: Secondary | ICD-10-CM

## 2014-12-16 HISTORY — DX: Other chronic pancreatitis: K86.1

## 2014-12-16 LAB — BASIC METABOLIC PANEL
ANION GAP: 6 (ref 5–15)
BUN: 9 mg/dL (ref 6–20)
CALCIUM: 7.1 mg/dL — AB (ref 8.9–10.3)
CO2: 23 mmol/L (ref 22–32)
Chloride: 101 mmol/L (ref 101–111)
Creatinine, Ser: 0.78 mg/dL (ref 0.44–1.00)
GFR calc Af Amer: >60 mL/min (ref >60)
GFR calc non Af Amer: >60 mL/min (ref >60)
GLUCOSE: 239 mg/dL — AB (ref 65–99)
Potassium: 3.9 mmol/L (ref 3.5–5.1)
Sodium: 130 mmol/L — ABNORMAL LOW (ref 135–145)

## 2014-12-16 LAB — PROCALCITONIN: Procalcitonin: 0.4 ng/mL

## 2014-12-16 LAB — URINE CULTURE

## 2014-12-16 LAB — OSMOLALITY, URINE: OSMOLALITY UR: 322 mosm/kg — AB (ref 390–1090)

## 2014-12-16 LAB — CBC
HEMATOCRIT: 22.9 % — AB (ref 36.0–46.0)
HEMOGLOBIN: 7.9 g/dL — AB (ref 12.0–15.0)
MCH: 28.1 pg (ref 26.0–34.0)
MCHC: 34.5 g/dL (ref 30.0–36.0)
MCV: 81.5 fL (ref 78.0–100.0)
Platelets: 114 10*3/uL — ABNORMAL LOW (ref 150–400)
RBC: 2.81 MIL/uL — ABNORMAL LOW (ref 3.87–5.11)
RDW: 16.7 % — AB (ref 11.5–15.5)
WBC: 5.2 10*3/uL (ref 4.0–10.5)

## 2014-12-16 LAB — OSMOLALITY: OSMOLALITY: 274 mosm/kg — AB (ref 275–300)

## 2014-12-16 LAB — GLUCOSE, CAPILLARY
GLUCOSE-CAPILLARY: 108 mg/dL — AB (ref 65–99)
GLUCOSE-CAPILLARY: 149 mg/dL — AB (ref 65–99)
Glucose-Capillary: 130 mg/dL — ABNORMAL HIGH (ref 65–99)
Glucose-Capillary: 132 mg/dL — ABNORMAL HIGH (ref 65–99)

## 2014-12-16 LAB — HEMOGLOBIN A1C
HEMOGLOBIN A1C: 5.2 % (ref 4.8–5.6)
MEAN PLASMA GLUCOSE: 103 mg/dL

## 2014-12-16 LAB — SODIUM, URINE, RANDOM: Sodium, Ur: 72 mmol/L

## 2014-12-16 MED ORDER — ALBUTEROL SULFATE (2.5 MG/3ML) 0.083% IN NEBU
2.5000 mg | INHALATION_SOLUTION | Freq: Four times a day (QID) | RESPIRATORY_TRACT | Status: DC
  Administered 2014-12-16 – 2014-12-18 (×7): 2.5 mg via RESPIRATORY_TRACT
  Filled 2014-12-16 (×7): qty 3

## 2014-12-16 MED ORDER — CETYLPYRIDINIUM CHLORIDE 0.05 % MT LIQD
7.0000 mL | Freq: Two times a day (BID) | OROMUCOSAL | Status: DC
  Administered 2014-12-16 – 2014-12-21 (×10): 7 mL via OROMUCOSAL

## 2014-12-16 MED ORDER — BUDESONIDE-FORMOTEROL FUMARATE 160-4.5 MCG/ACT IN AERO
2.0000 | INHALATION_SPRAY | Freq: Two times a day (BID) | RESPIRATORY_TRACT | Status: DC
  Administered 2014-12-16 – 2014-12-21 (×11): 2 via RESPIRATORY_TRACT
  Filled 2014-12-16: qty 6

## 2014-12-16 MED ORDER — DIPHENHYDRAMINE HCL 25 MG PO CAPS: 25.0000 mg | ORAL_CAPSULE | ORAL | Status: DC | PRN

## 2014-12-16 MED ORDER — SODIUM CHLORIDE 0.9 % IV SOLN
2.0000 g | Freq: Once | INTRAVENOUS | Status: AC
  Administered 2014-12-16: 2 g via INTRAVENOUS
  Filled 2014-12-16: qty 20

## 2014-12-16 MED ORDER — FUROSEMIDE 10 MG/ML IJ SOLN
40.0000 mg | Freq: Once | INTRAMUSCULAR | Status: AC
  Administered 2014-12-16: 40 mg via INTRAVENOUS
  Filled 2014-12-16: qty 4

## 2014-12-16 MED ORDER — LIP MEDEX EX OINT
TOPICAL_OINTMENT | CUTANEOUS | Status: AC
  Administered 2014-12-16: 1
  Filled 2014-12-16: qty 7

## 2014-12-16 MED ORDER — TIOTROPIUM BROMIDE MONOHYDRATE 18 MCG IN CAPS
18.0000 ug | ORAL_CAPSULE | Freq: Every day | RESPIRATORY_TRACT | Status: DC
  Administered 2014-12-16 – 2014-12-20 (×5): 18 ug via RESPIRATORY_TRACT
  Filled 2014-12-16 (×2): qty 5

## 2014-12-16 NOTE — Progress Notes (Signed)
Progress Note   Marie Brooks GBT:[id number] DOB: [birth year] DOA: 12/14/2014 PCP: Andrena Mews, NP   Brief Narrative:   Marie Brooks is an 54 y.o. female with a PMH of cirrhosis, hypertension, COPD, CHF (EF 55-60 percent by cath done 10/31/13) and ongoing alcohol abuse who was admitted 12/14/14 with a chief complaint of worsening dyspnea and abdominal bloating. A 4 L paracentesis was performed in the ED, after which she became hypotensive. She was also noted to have a hemoglobin of 5.5 mg/dL (recently evaluated in the ED for melena) and a temperature of 103F.  Assessment/Plan:   Principal Problem:   Sepsis secondary to ? SBP (spontaneous bacterial peritonitis) versus UTI with septic versus hemorrhagic shock - Sepsis criteria met with lactic acidosis fever, tachycardia, tachypnea and hypotension with source of infection: SBP - Chest x-ray was clear. - Blood pressure responded to fluid bolus challenge and administration of blood. Currently hemodynamically stable. - Pancultured (blood, urine, sputum, ascitic fluid and stool culture sent as well as C. difficile PCR). - C. difficile PCR negative. Discontinue Flagyl. - Urine culture positive for Escherichia coli. Continue Rocephin. - Follow-up blood cultures and culture of ascitic fluid.  Active Problems:   Dyspnea - Give 1 dose of Lasix (suspect volume overload given mild interstitial edema noted on CXR done 12/15/14).    Hyperglycemia - Check hemoglobin A1c and CBGs.    Hernia of abdominal cavity - Not incarcerated. Evaluated by general surgery. No current indication for repair.    Chronic pancreatitis - Calcifications of pancreas noted on CT of the abdomen.    Acute blood loss anemia/symptomatic anemia secondary to GI bleeding/melena/heme positive stool - Given 1 unit of PRBCs last night. (Never received 2 units ordered in the ED as repeat hemoglobin was 8.9) - Continue octreotide. No current recommendations for EGD per  GI.    History of HTN (hypertension), hypotensive on admission / ? H/O CHF - Blood pressure stabilized after administration of albumin and fluid challenge, but remains low. - Antihypertensives including Coreg, Lasix, lisinopril and spironolactone on hold.  Give 1 dose of Lasix today. - The patient had no evidence of systolic or diastolic dysfunction on 2-D echo done 10/31/13. - Unclear where the diagnosis of CHF was made, suspect she had pulmonary edema related to low albumin state.    Cirrhosis with Ascites / coagulopathy and thrombocytopenia secondary to cirrhosis in the setting of alcohol abuse - Ammonia not elevated. Lactulose on hold. Continue rifaximin.    Alcohol abuse - No reported alcohol intake since 10/20/14. - Ativan ordered as needed for withdrawal symptoms. - Continue Folvite and thiamine.    COPD / acute on chronic hypoxic respiratory failure - Continue Dulera, supplemental oxygen and bronchodilators. Resume Symbicort and Spiriva.    Hyponatremia - Mild. Give 1 dose of Lasix today, fluids now at [facility].    Hypocalcemia - Give another 2 g of calcium gluconate.    DVT Prophylaxis - SCDs only in the setting of probable GI bleeding.  Family Communication: No family at the bedside. Disposition Plan: Home when stable, likely several more days. Code Status:     Code Status Orders        Start     Ordered   12/15/14 0037  Full code   Continuous     12/15/14 0036        IV Access:    Right IJ placed 12/14/14 (due out by 12/19/14)   Procedures and diagnostic studies:   Ct Abdomen  Pelvis Wo Contrast  12/15/2014   CLINICAL DATA:  Umbilical hernia.  EXAM: CT ABDOMEN AND PELVIS WITHOUT CONTRAST  TECHNIQUE: Multidetector CT imaging of the abdomen and pelvis was performed following the standard protocol without IV contrast.  COMPARISON:  CT scan of October 24, 2013.  FINDINGS: Moderate degenerative disc disease is noted at L5-S1. Visualized lung bases appear normal.  Small  solitary gallstone is noted. No focal abnormality seen in the liver or spleen on these unenhanced images. Pancreatic calcifications are noted consistent with chronic pancreatitis. Adrenal glands and kidneys appear normal. No hydronephrosis or renal obstruction is noted. Moderate ascites is noted. No colonic dilatation is noted. The appendix appears normal. Mildly dilated small bowel loops are noted centrally within the abdomen most consistent with ileus. No definite transition zone is noted. Urinary bladder appears normal. Uterus and ovaries are unremarkable. Small fat containing periumbilical hernia is noted. There is no evidence of bowel incarceration. Mild anasarca is noted. No significant adenopathy is noted. Atherosclerosis of abdominal aorta is noted without aneurysm formation.  IMPRESSION: Moderate ascites is noted.  Atherosclerosis of abdominal aorta without aneurysm formation.  Pancreatic calcifications consistent with chronic pancreatitis.  Small solitary gallstone.  Mildly dilated small bowel loops are noted centrally in the abdomen most consistent with ileus. Continued radiographic follow-up is recommended.  Mild anasarca is noted.   Electronically Signed   By: Marijo Conception, M.D.   On: 12/15/2014 12:41   Dg Chest 2 View  12/14/2014   CLINICAL DATA:  Fever.  EXAM: CHEST  2 VIEW  COMPARISON:  10/28/2014  FINDINGS: The heart size and mediastinal contours are within normal limits. Both lungs are clear. The visualized skeletal structures are unremarkable.  IMPRESSION: No active cardiopulmonary disease.   Electronically Signed   By: Kerby Moors M.D.   On: 12/14/2014 20:08   Dg Chest Port 1 View  12/15/2014   CLINICAL DATA:  Hypoxia  EXAM: PORTABLE CHEST - 1 VIEW  COMPARISON:  Chest x-rays dated 12/14/2014 and 09/06/2014.  FINDINGS: Cardiomediastinal silhouette remains normal in size and configuration. Suspect minimal subsegmental atelectasis at each lung base. There is mild interstitial prominence  bilaterally which is without significant change compared to multiple prior studies. Lungs otherwise clear. No confluent airspace opacity to suggest a developing pneumonia. No pleural effusion seen. No pneumothorax. Lung volumes are normal. No osseous abnormality seen.  IMPRESSION: Probable mild subsegmental atelectasis at each lung base.  Mild interstitial prominence bilaterally which I suspect is chronic but could indicate a mild interstitial edema.  Otherwise unremarkable chest x-ray.  No evidence of pneumonia.   Electronically Signed   By: Franki Cabot M.D.   On: 12/15/2014 07:07     Medical Consultants:    Pulmonology/critical care: Corey Harold, NP  Anti-Infectives:    Cefotaxime, start date 8/25 >>>8/26  Zosyn, start date 8/25 >>>8/26  Vancomycin, start date 8/25 >>>8/26  Flagyl start date 8/25 >>>8/27  Rocephin 8/26 >>>  Subjective:   Marie Brooks reports dyspnea and return of diarrhea today.  Has pruritic skin around umbilical hernia.  .  Objective:    Filed Vitals:   12/16/14 0400 12/16/14 0458 12/16/14 0500 12/16/14 0600  BP: 98/52  88/64 91/56  Pulse: 98  95 92  Temp:  100.1 F (37.8 C)    TempSrc:  Axillary    Resp: 23  21 18   Height:      Weight:  71.2 kg (156 lb 15.5 oz)    SpO2: 98%  99% 99%    Intake/Output Summary (Last 24 hours) at 12/16/14 8185 Last data filed at 12/16/14 0600  Gross per 24 hour  Intake 3482.08 ml  Output      0 ml  Net 3482.08 ml    Exam: Gen:  NAD, lethargic Cardiovascular:  Tachy, No M/R/G Respiratory:  Lungs diminished with occasional faint wheeze Gastrointestinal:  Abdomen distended, tender with umbilical hernia Extremities:  1+ edema   Data Reviewed:    Labs: Basic Metabolic Panel:  Recent Labs Lab 12/14/14 2035 12/15/14 0037 12/15/14 0357 12/16/14 0353  NA 132* 131* 133* 130*  K 3.8 3.2* 3.5 3.9  CL 96* 96* 97* 101  CO2 29 29 28 23   GLUCOSE 103* 106* 120* 239*  BUN 9 9 8 9   CREATININE 0.77 0.79  0.79 0.78  CALCIUM 7.9* 7.7* 8.1* 7.1*   GFR Estimated Creatinine Clearance: 78.2 mL/min (by C-G formula based on Cr of 0.78). Liver Function Tests:  Recent Labs Lab 12/14/14 2035 12/15/14 0037 12/15/14 0357  AST 38 31 33  ALT 15 13* 12*  ALKPHOS 139* 127* 116  BILITOT 2.4* 2.9* 2.7*  PROT 6.9 6.8 6.3*  ALBUMIN 2.8* 3.1* 2.8*    Recent Labs Lab 12/15/14 0646  AMMONIA 28   Coagulation profile  Recent Labs Lab 12/14/14 2310 12/15/14 0357  INR 1.79* 1.80*    CBC:  Recent Labs Lab 12/14/14 2035 12/15/14 0037 12/15/14 0357 12/15/14 0646 12/15/14 1804 12/16/14 0353  WBC 12.6* 8.5 9.4  --   --  5.2  NEUTROABS 9.4* 5.7  --   --   --   --   HGB 5.9* 8.9* 8.9* 8.6* 7.1* 7.9*  HCT 18.4* 26.9* 25.8* 24.9* 22.6* 22.9*  MCV 82.5 82.0 81.1  --   --  81.5  PLT 185 149* 164  --   --  114*   Thyroid function studies:  Recent Labs  12/15/14 0117  TSH 0.748   Sepsis Labs:  Recent Labs Lab 12/14/14 2035 12/14/14 2045 12/14/14 2316 12/15/14 0037 12/15/14 0117 12/15/14 0357 12/15/14 0403 12/16/14 0353  PROCALCITON  --   --   --  0.41  --  0.46  --  0.40  WBC 12.6*  --   --  8.5  --  9.4  --  5.2  LATICACIDVEN  --  2.74* 1.03  --  1.4  --  1.9  --    Microbiology Recent Results (from the past 240 hour(s))  Urine culture     Status: None (Preliminary result)   Collection Time: 12/14/14  7:52 PM  Result Value Ref Range Status   Specimen Description URINE, CLEAN CATCH  Final   Special Requests NONE  Final   Culture   Final    >=100,000 COLONIES/mL ESCHERICHIA COLI Performed at [facility]    Report Status PENDING  Incomplete  Blood Culture (routine x 2)     Status: None (Preliminary result)   Collection Time: 12/14/14  8:35 PM  Result Value Ref Range Status   Specimen Description BLOOD RIGHT HAND  Final   Special Requests BOTTLES DRAWN AEROBIC ONLY 5ML  Final   Culture   Final    NO GROWTH < 24 HOURS Performed at [facility]     Report Status PENDING  Incomplete  Gram stain     Status: None (Preliminary result)   Collection Time: 12/14/14  9:53 PM  Result Value Ref Range Status   Specimen Description FLUID  Final  Special Requests NONE  Final   Gram Stain   Final    RARE WBC PRESENT,BOTH PMN AND MONONUCLEAR NO ORGANISMS SEEN Performed at [facility]    Report Status PENDING  Incomplete  MRSA PCR Screening     Status: None   Collection Time: 12/15/14 12:20 AM  Result Value Ref Range Status   MRSA by PCR NEGATIVE NEGATIVE Final    Comment:        The GeneXpert MRSA Assay (FDA approved for NASAL specimens only), is one component of a comprehensive MRSA colonization surveillance program. It is not intended to diagnose MRSA infection nor to guide or monitor treatment for MRSA infections.   C difficile quick scan w PCR reflex     Status: None   Collection Time: 12/15/14 11:06 AM  Result Value Ref Range Status   C Diff antigen NEGATIVE NEGATIVE Final   C Diff toxin NEGATIVE NEGATIVE Final   C Diff interpretation Negative for toxigenic C. difficile  Final     Medications:   . sodium chloride   Intravenous Once  . antiseptic oral rinse  7 mL Mouth Rinse BID  . cefTRIAXone (ROCEPHIN)  IV  2 g Intravenous Q24H  . folic acid  1 mg Oral Daily  . hydrocortisone cream  1 application Topical BID  . ipratropium-albuterol  3 mL Nebulization Q6H  . metronidazole  500 mg Intravenous Q8H  . multivitamin with minerals  1 tablet Oral Daily  . pantoprazole  40 mg Oral Daily  . rifaximin  550 mg Oral BID  . sodium chloride  3 mL Intravenous Q12H  . thiamine  100 mg Oral Daily   Continuous Infusions: . sodium chloride 1,000 mL (12/16/14 0112)  . octreotide  (SANDOSTATIN)    IV infusion 50 mcg/hr (12/15/14 2036)    Time spent: 35 minutes.  The patient is medically complex and requires high complexity decision making.    LOS: 2 days   Mehmet Scally  [facility] Pager [phone]. If unable  to reach me by pager, please call my cell phone at [phone].  *Please refer to [email or web address], password [email or web address] to get updated schedule on who will round on this patient, as hospitalists switch teams weekly. If 7PM-7AM, please contact night-coverage at [email or web address], password [email or web address] for any overnight needs.  12/16/2014, 7:12 AM

## 2014-12-16 NOTE — Consult Note (Signed)
PULMONARY / CRITICAL CARE MEDICINE   Name: Marie Brooks MRN: [id number] DOB: 16-Dec-1960    ADMISSION DATE:  12/14/2014 CONSULTATION DATE:  12/14/2014  REFERRING MD :  Dr. Welton Flakes  CHIEF COMPLAINT:  Abd Pain  INITIAL PRESENTATION: 54 year old female with Alcoholic cirrhosis presented to [facility] 8/25 with complaints of abdominal pain and distention. In ED she was noted to profoundly anemic and hypotensive. Paracentesis was performed removing 4L. ED starting volume resuscitation now, PCCM to see.    STUDIES:  8/25 Hemoccult >>> 8/25 Ct abdomen/pelvis >>>  SIGNIFICANT EVENTS: 8/25 Admit 8/27- no pressors, eating    SUBJECTIVE:  No distress  VITAL SIGNS: Temp:  [98.4 F (36.9 C)-100.1 F (37.8 C)] 98.7 F (37.1 C) (08/27 0800) Pulse Rate:  [81-109] 92 (08/27 0600) Resp:  [16-23] 18 (08/27 0600) BP: (74-117)/(36-66) 91/56 mmHg (08/27 0600) SpO2:  [98 %-100 %] 99 % (08/27 0600) Weight:  [71.2 kg (156 lb 15.5 oz)] 71.2 kg (156 lb 15.5 oz) (08/27 0458) HEMODYNAMICS:   VENTILATOR SETTINGS:   INTAKE / OUTPUT:  Intake/Output Summary (Last 24 hours) at 12/16/14 0849 Last data filed at 12/16/14 0700  Gross per 24 hour  Intake 3582.08 ml  Output      0 ml  Net 3582.08 ml    PHYSICAL EXAMINATION: General:  54 year old female, chronically ill appearing Neuro:  Alert, oriented, non-focal HEENT:  jvd wnl Cardiovascular:  RRR, no MRG Lungs:  Diminished, clear Abdomen:  Distended, fluctuant with wave, generalized tenderness. Umbilical hernia protruding Musculoskeletal:  No acute deformity or ROM limitation.  Skin:  Grossly intact  LABS:  CBC  Recent Labs Lab 12/15/14 0037 12/15/14 0357 12/15/14 0646 12/15/14 1804 12/16/14 0353  WBC 8.5 9.4  --   --  5.2  HGB 8.9* 8.9* 8.6* 7.1* 7.9*  HCT 26.9* 25.8* 24.9* 22.6* 22.9*  PLT 149* 164  --   --  114*   Coag's  Recent Labs Lab 12/14/14 2310 12/15/14 0357  APTT  --  43*  INR 1.79* 1.80*   BMET  Recent Labs Lab  12/15/14 0037 12/15/14 0357 12/16/14 0353  NA 131* 133* 130*  K 3.2* 3.5 3.9  CL 96* 97* 101  CO2 BUN CREATININE 0.79 0.79 0.78  GLUCOSE 106* 120* 239*   Electrolytes  Recent Labs Lab 12/15/14 0037 12/15/14 0357 12/16/14 0353  CALCIUM 7.7* 8.1* 7.1*   Sepsis Markers  Recent Labs Lab 12/14/14 2316 12/15/14 0037 12/15/14 0117 12/15/14 0357 12/15/14 0403 12/16/14 0353  LATICACIDVEN 1.03  --  1.4  --  1.9  --   PROCALCITON  --  0.41  --  0.46  --  0.40   ABG No results for input(s): PHART, PCO2ART, PO2ART in the last 168 hours. Liver Enzymes  Recent Labs Lab 12/14/14 2035 12/15/14 0037 12/15/14 0357  AST 38 31 33  ALT 15 13* 12*  ALKPHOS 139* 127* 116  BILITOT 2.4* 2.9* 2.7*  ALBUMIN 2.8* 3.1* 2.8*   Cardiac Enzymes No results for input(s): TROPONINI, PROBNP in the last 168 hours. Glucose  Recent Labs Lab 12/15/14 0808 12/16/14 0804  GLUCAP 132* 130*    Imaging Ct Abdomen Pelvis Wo Contrast  12/15/2014   CLINICAL DATA:  Umbilical hernia.  EXAM: CT ABDOMEN AND PELVIS WITHOUT CONTRAST  TECHNIQUE: Multidetector CT imaging of the abdomen and pelvis was performed following the standard protocol without IV contrast.  COMPARISON:  CT scan of October 24, 2013.  FINDINGS: Moderate degenerative disc disease is noted at L5-S1. Visualized lung bases appear normal.  Small solitary gallstone is noted. No focal abnormality seen in the liver or spleen on these unenhanced images. Pancreatic calcifications are noted consistent with chronic pancreatitis. Adrenal glands and kidneys appear normal. No hydronephrosis or renal obstruction is noted. Moderate ascites is noted. No colonic dilatation is noted. The appendix appears normal. Mildly dilated small bowel loops are noted centrally within the abdomen most consistent with ileus. No definite transition zone is noted. Urinary bladder appears normal. Uterus and ovaries are unremarkable. Small fat containing  periumbilical hernia is noted. There is no evidence of bowel incarceration. Mild anasarca is noted. No significant adenopathy is noted. Atherosclerosis of abdominal aorta is noted without aneurysm formation.  IMPRESSION: Moderate ascites is noted.  Atherosclerosis of abdominal aorta without aneurysm formation.  Pancreatic calcifications consistent with chronic pancreatitis.  Small solitary gallstone.  Mildly dilated small bowel loops are noted centrally in the abdomen most consistent with ileus. Continued radiographic follow-up is recommended.  Mild anasarca is noted.   Electronically Signed   By: Lupita Raider, M.D.   On: 12/15/2014 12:41     ASSESSMENT / PLAN:  PULMONARY A: Acute on chronic hypoxic respiratory failure COPD on home O2 without acute exacerbation  P:   Supplemental O2 too keep SpO2 > 92% IS as tolerated Bronchodilators to nebulized No repeat pcxr needed Sit upright  CARDIOVASCULAR A:  Shock - improved with volume expansion- resolved Chronic CHF Elevated lactic > cleared H/o HTN  P:  Telemetry monitoring MAP goal > 60 Troponin, no further needed Lactic cleared in ED- reassuring Albumin would be reasonable in future if drops BP   RENAL A:   Hyponatremia Hypocalcemia  P:   Follow Bmet Assess serum osm, urine na, osm likely at this stage need to kvo, will need lasix in future  GASTROINTESTINAL A:   Alcoholic cirrhosis Suspect GI bleeding Ascites  Umbilical hernia Diarrhea  P:   Gi called Surgery  Note reviewed Diet cdiff neg  HEMATOLOGIC A:   Acute blood loss anemia, suspect GI bleed Coagulopathy secondary to cirrhosis  P:  Serial CBC scd Gi evaluated  INFECTIOUS A:   No evidence SBP NO evidence sepsis  Cdif -neg   P:   BCx2 8/25 >>> UC 8/25 >>> Sputum 8/25 >>> PCT neg  Stool culture C-dif PCR  Abx: cefotaxime, start date 8/25 >>> Abx: zosyn, start date 8/25 >>>dc Abx: vancomycin, start date 8/25 >>>dc Abx: Flagyl start  date 8/25 >>>dc  consider dc ceftriaxone, neg sbp, no GI bleed per GI so no need prevention ABX  Para neg sbp  ENDOCRINE A:   No acute issues   P:   Monitor  NEUROLOGIC A:   No acute issues  P:   RASS goal: 0 Check ammonia with AM labs, holding lactulose and rifaximin  [place]  Sign off, call if needed  Marie Brooks. Tyson Alias, MD, FACP Pgr: [phone] [facility]

## 2014-12-16 NOTE — Progress Notes (Signed)
Gastroenterology Progress Note  Subjective:  Had blood in urine but no sign of blood in stool per patient and nurse.  Diarrhea is less.  Still with abdominal pain mostly around umbilicus.  Objective:  Vital signs in last 24 hours: Temp:  [98.4 F (36.9 C)-100.1 F (37.8 C)] 98.7 F (37.1 C) (08/27 0800) Pulse Rate:  [81-99] 92 (08/27 0600) Resp:  [16-23] 18 (08/27 0600) BP: (74-117)/(36-66) 91/56 mmHg (08/27 0600) SpO2:  [97 %-100 %] 97 % (08/27 0919) Weight:  [156 lb 15.5 oz (71.2 kg)] 156 lb 15.5 oz (71.2 kg) (08/27 0458) Last BM Date: 12/15/14 General:  Alert, chronically ill-appearing, in NAD Heart:  Regular rate and rhythm; no murmurs Pulm:  Decreased BS Abdomen:  Distended with ascites fluid.  BS present.  Large umbilical hernia noted with tenderness. Extremities:  Without edema. Neurologic:  Alert and  oriented x 4; grossly normal neurologically. Psych:  Alert and cooperative. Normal mood and affect.  Intake/Output from previous day: 08/26 0701 - 08/27 0700 In: 3582.1 [P.O.:1010; I.V.:1787.1; Blood:335; IV Piggyback:450] Out: -   Lab Results:  Recent Labs  12/15/14 0037 12/15/14 0357 12/15/14 0646 12/15/14 1804 12/16/14 0353  WBC 8.5 9.4  --   --  5.2  HGB 8.9* 8.9* 8.6* 7.1* 7.9*  HCT 26.9* 25.8* 24.9* 22.6* 22.9*  PLT 149* 164  --   --  114*   BMET  Recent Labs  12/15/14 0037 12/15/14 0357 12/16/14 0353  NA 131* 133* 130*  K 3.2* 3.5 3.9  CL 96* 97* 101  CO2 29 28 23   GLUCOSE 106* 120* 239*  BUN 9 8 9   CREATININE 0.79 0.79 0.78  CALCIUM 7.7* 8.1* 7.1*   LFT  Recent Labs  12/15/14 0357  PROT 6.3*  ALBUMIN 2.8*  AST 33  ALT 12*  ALKPHOS 116  BILITOT 2.7*   PT/INR  Recent Labs  12/14/14 2310 12/15/14 0357  LABPROT 20.7* 20.9*  INR 1.79* 1.80*   Ct Abdomen Pelvis Wo Contrast  12/15/2014   CLINICAL DATA:  Umbilical hernia.  EXAM: CT ABDOMEN AND PELVIS WITHOUT CONTRAST  TECHNIQUE: Multidetector CT imaging of the  abdomen and pelvis was performed following the standard protocol without IV contrast.  COMPARISON:  CT scan of October 24, 2013.  FINDINGS: Moderate degenerative disc disease is noted at L5-S1. Visualized lung bases appear normal.  Small solitary gallstone is noted. No focal abnormality seen in the liver or spleen on these unenhanced images. Pancreatic calcifications are noted consistent with chronic pancreatitis. Adrenal glands and kidneys appear normal. No hydronephrosis or renal obstruction is noted. Moderate ascites is noted. No colonic dilatation is noted. The appendix appears normal. Mildly dilated small bowel loops are noted centrally within the abdomen most consistent with ileus. No definite transition zone is noted. Urinary bladder appears normal. Uterus and ovaries are unremarkable. Small fat containing periumbilical hernia is noted. There is no evidence of bowel incarceration. Mild anasarca is noted. No significant adenopathy is noted. Atherosclerosis of abdominal aorta is noted without aneurysm formation.  IMPRESSION: Moderate ascites is noted.  Atherosclerosis of abdominal aorta without aneurysm formation.  Pancreatic calcifications consistent with chronic pancreatitis.  Small solitary gallstone.  Mildly dilated small bowel loops are noted centrally in the abdomen most consistent with ileus. Continued radiographic follow-up is recommended.  Mild anasarca is noted.   Electronically Signed   By: Marijo Conception, M.D.   On: 12/15/2014 12:41   Dg Chest 2 View  12/14/2014  CLINICAL DATA:  Fever.  EXAM: CHEST  2 VIEW  COMPARISON:  10/28/2014  FINDINGS: The heart size and mediastinal contours are within normal limits. Both lungs are clear. The visualized skeletal structures are unremarkable.  IMPRESSION: No active cardiopulmonary disease.   Electronically Signed   By: Kerby Moors M.D.   On: 12/14/2014 20:08   Dg Chest Port 1 View  12/15/2014   CLINICAL DATA:  Hypoxia  EXAM: PORTABLE CHEST - 1 VIEW   COMPARISON:  Chest x-rays dated 12/14/2014 and 09/06/2014.  FINDINGS: Cardiomediastinal silhouette remains normal in size and configuration. Suspect minimal subsegmental atelectasis at each lung base. There is mild interstitial prominence bilaterally which is without significant change compared to multiple prior studies. Lungs otherwise clear. No confluent airspace opacity to suggest a developing pneumonia. No pleural effusion seen. No pneumothorax. Lung volumes are normal. No osseous abnormality seen.  IMPRESSION: Probable mild subsegmental atelectasis at each lung base.  Mild interstitial prominence bilaterally which I suspect is chronic but could indicate a mild interstitial edema.  Otherwise unremarkable chest x-ray.  No evidence of pneumonia.   Electronically Signed   By: Franki Cabot M.D.   On: 12/15/2014 07:07   Assessment / Plan: *Sepsis: Met sepsis criteria with lactic acidosis, fever, tachycardia, hypotension. Paracentesis performed in ED, but studies not sent for cell count. Antibiotics were started and fluid studies sent finally yesterday AM, cell count negative for SBP. *Anemia: Last Hgb for comparison was one year ago. Had reported black stools and are heme positive, but trace light brown stool on my exam 8/26.  Received only 1 unit PRBC's last night.  Hgb of 5.8 grams was erroneous. 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  *Alcohol abuse: No reported alcohol intake since 10/20/14. *Large tender umbilical hernia: CT scan does not show incarceration/strangulation.  Surgery consulted and no indication for surgery currently. *Diarrhea: Cdiff negative. Was on lactulose and xifaxan at home.  Lactulose on hold but Xifaxan being continued. *Chronic pancreatitis:  Calcifications seen on CT scan.  -Monitor Hgb and for signs of active bleeding.  Transfuse prn. -Started octreotide empirically, which could be continued  for 72 hours. -Will need to restart diuretics when BP improved and can tolerate these (was on some as outpatient).  Ascites management likely going to be difficult in this patient.   LOS: 2 days   ZEHR, JESSICA D.  12/16/2014, 10:31 AM  Pager number [phone]  [place] Attending  I have also seen and assessed the patient and agree with the advanced practitioner's assessment and plan. 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 "I dont drink that much"  Main issue here now is volume management - still has large ascites Agree w/ current management Should try to start diuretics when BP allows - remembering that it is common for cirrhootics to have lower BP as they do not respond to natural catecholamines  Well  May need some pancreatic enzymes - not sure she would take as an outpt Needs an EGD at some point - does not have to be during hospitalization per se  Gatha Mayer, MD, [facility] [phone] (pager) 12/16/2014 12:29 PM

## 2014-12-17 DIAGNOSIS — I959 Hypotension, unspecified: Secondary | ICD-10-CM

## 2014-12-17 DIAGNOSIS — R739 Hyperglycemia, unspecified: Secondary | ICD-10-CM

## 2014-12-17 LAB — CBC
HEMATOCRIT: 26.5 % — AB (ref 36.0–46.0)
HEMOGLOBIN: 9 g/dL — AB (ref 12.0–15.0)
MCH: 27.8 pg (ref 26.0–34.0)
MCHC: 34 g/dL (ref 30.0–36.0)
MCV: 81.8 fL (ref 78.0–100.0)
Platelets: 136 10*3/uL — ABNORMAL LOW (ref 150–400)
RBC: 3.24 MIL/uL — AB (ref 3.87–5.11)
RDW: 17.2 % — ABNORMAL HIGH (ref 11.5–15.5)
WBC: 5.9 10*3/uL (ref 4.0–10.5)

## 2014-12-17 LAB — BASIC METABOLIC PANEL
ANION GAP: 4 — AB (ref 5–15)
BUN: 9 mg/dL (ref 6–20)
CO2: 27 mmol/L (ref 22–32)
Calcium: 7.4 mg/dL — ABNORMAL LOW (ref 8.9–10.3)
Chloride: 105 mmol/L (ref 101–111)
Creatinine, Ser: 0.75 mg/dL (ref 0.44–1.00)
GFR calc Af Amer: >60 mL/min (ref >60)
GLUCOSE: 107 mg/dL — AB (ref 65–99)
POTASSIUM: 4.4 mmol/L (ref 3.5–5.1)
Sodium: 136 mmol/L (ref 135–145)

## 2014-12-17 LAB — GLUCOSE, CAPILLARY
GLUCOSE-CAPILLARY: 132 mg/dL — AB (ref 65–99)
GLUCOSE-CAPILLARY: 160 mg/dL — AB (ref 65–99)
Glucose-Capillary: 267 mg/dL — ABNORMAL HIGH (ref 65–99)
Glucose-Capillary: 109 mg/dL — ABNORMAL HIGH (ref 65–99)

## 2014-12-17 LAB — PROCALCITONIN: PROCALCITONIN: 0.26 ng/mL

## 2014-12-17 MED ORDER — PANCRELIPASE (LIP-PROT-AMYL) 12000-38000 UNITS PO CPEP
36000.0000 [IU] | ORAL_CAPSULE | Freq: Three times a day (TID) | ORAL | Status: DC
  Administered 2014-12-17 – 2014-12-21 (×12): 36000 [IU] via ORAL
  Filled 2014-12-17 (×14): qty 3

## 2014-12-17 MED ORDER — SPIRONOLACTONE 50 MG PO TABS
50.0000 mg | ORAL_TABLET | Freq: Every day | ORAL | Status: DC
  Administered 2014-12-17: 50 mg via ORAL
  Filled 2014-12-17: qty 2
  Filled 2014-12-17: qty 1

## 2014-12-17 MED ORDER — PANCRELIPASE (LIP-PROT-AMYL) 12000-38000 UNITS PO CPEP
12000.0000 [IU] | ORAL_CAPSULE | Freq: Every day | ORAL | Status: DC | PRN
  Filled 2014-12-17: qty 1

## 2014-12-17 MED ORDER — INSULIN ASPART 100 UNIT/ML ~~LOC~~ SOLN: 0.0000 [IU] | Freq: Every day | SUBCUTANEOUS | Status: DC

## 2014-12-17 MED ORDER — FUROSEMIDE 10 MG/ML IJ SOLN
20.0000 mg | Freq: Two times a day (BID) | INTRAMUSCULAR | Status: DC
  Administered 2014-12-17 – 2014-12-18 (×2): 20 mg via INTRAVENOUS
  Filled 2014-12-17 (×4): qty 2

## 2014-12-17 MED ORDER — INSULIN ASPART 100 UNIT/ML ~~LOC~~ SOLN
0.0000 [IU] | Freq: Three times a day (TID) | SUBCUTANEOUS | Status: DC
  Administered 2014-12-18: 3 [IU] via SUBCUTANEOUS

## 2014-12-17 NOTE — Progress Notes (Signed)
[facility] Progress Note  Subjective:  No new issues.  No sign of active/overt GI bleeding.  Being transferred out of ICU/SD today.  Objective:  Vital signs in last 24 hours: Temp:  [98 F (36.7 C)-98.9 F (37.2 C)] 98 F (36.7 C) (08/28 0740) Pulse Rate:  [75-96] 91 (08/28 0800) Resp:  [15-27] 19 (08/28 0800) BP: (86-115)/(35-81) 95/52 mmHg (08/28 0400) SpO2:  [98 %-100 %] 100 % (08/28 0827) FiO2 (%):  [100 %] 100 % (08/27 1408) Weight:  [170 lb 3.1 oz (77.2 kg)] 170 lb 3.1 oz (77.2 kg) (08/28 0500) Last BM Date: 12/15/14 General:  Alert, chronically ill-appearing, in NAD Heart:  Regular rate and rhythm; no murmurs Pulm:  Decreased BS B/L Abdomen:  Distended with ascites fluid.  BS present.  Large tender umbilical hernia noted. Extremities:  Without edema. Neurologic:  Alert and  oriented x4;  grossly normal neurologically. Psych:  Alert and cooperative. Normal mood and affect.  Intake/Output from previous day: 08/27 0701 - 08/28 0700 In: 180 [I.V.:180] Out: 1400 [Urine:1400] Intake/Output this shift: Total I/O In: 240 [P.O.:240] Out: 600 [Urine:600]  Lab Results:  Recent Labs  12/15/14 0357  12/15/14 1804 12/16/14 0353 12/17/14 0421  WBC 9.4  --   --  5.2 5.9  HGB 8.9*  < > 7.1* 7.9* 9.0*  HCT 25.8*  < > 22.6* 22.9* 26.5*  PLT 164  --   --  114* 136*  < > = values in this interval not displayed. BMET  Recent Labs  12/15/14 0357 12/16/14 0353 12/17/14 0421  NA 133* 130* 136  K 3.5 3.9 4.4  CL 97* 101 105  CO2 28 23 27   GLUCOSE 120* 239* 107*  BUN 8 9 9   CREATININE 0.79 0.78 0.75  CALCIUM 8.1* 7.1* 7.4*   LFT  Recent Labs  12/15/14 0357  PROT 6.3*  ALBUMIN 2.8*  AST 33  ALT 12*  ALKPHOS 116  BILITOT 2.7*   PT/INR  Recent Labs  12/14/14 2310 12/15/14 0357  LABPROT 20.7* 20.9*  INR 1.79* 1.80*   Ct Abdomen Pelvis Wo Contrast  12/15/2014   CLINICAL DATA:  Umbilical hernia.  EXAM: CT ABDOMEN AND PELVIS WITHOUT  CONTRAST  TECHNIQUE: Multidetector CT imaging of the abdomen and pelvis was performed following the standard protocol without IV contrast.  COMPARISON:  CT scan of October 24, 2013.  FINDINGS: Moderate degenerative disc disease is noted at L5-S1. Visualized lung bases appear normal.  Small solitary gallstone is noted. No focal abnormality seen in the liver or spleen on these unenhanced images. Pancreatic calcifications are noted consistent with chronic pancreatitis. Adrenal glands and kidneys appear normal. No hydronephrosis or renal obstruction is noted. Moderate ascites is noted. No colonic dilatation is noted. The appendix appears normal. Mildly dilated small bowel loops are noted centrally within the abdomen most consistent with ileus. No definite transition zone is noted. Urinary bladder appears normal. Uterus and ovaries are unremarkable. Small fat containing periumbilical hernia is noted. There is no evidence of bowel incarceration. Mild anasarca is noted. No significant adenopathy is noted. Atherosclerosis of abdominal aorta is noted without aneurysm formation.  IMPRESSION: Moderate ascites is noted.  Atherosclerosis of abdominal aorta without aneurysm formation.  Pancreatic calcifications consistent with chronic pancreatitis.  Small solitary gallstone.  Mildly dilated small bowel loops are noted centrally in the abdomen most consistent with ileus. Continued radiographic follow-up is recommended.  Mild anasarca is noted.   Electronically Signed   By: Jeneen Rinks  Murlean Caller, M.D.   On: 12/15/2014 12:41    Assessment / Plan: *Sepsis: Met sepsis criteria with lactic acidosis, fever, tachycardia, hypotension. Paracentesis performed in ED, but studies not sent for cell count. Antibiotics were started and fluid studies finally sent later, cell count negative for SBP. *Anemia: Last Hgb for comparison was one year ago. Had reported black stools and are heme positive, but trace light brown stool on my exam 8/26.  Received only 1 unit PRBC's. Hgb of 5.8 grams was erroneous.  Hgb stable at 9.0 grams this AM. 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  *Alcohol abuse: No reported alcohol intake since 10/20/14. *Large tender umbilical hernia: CT scan does not show incarceration/strangulation. Surgery consulted and no indication for surgery currently. *Diarrhea: Cdiff negative. Was on lactulose and xifaxan at home. Lactulose on hold but Xifaxan being continued. *Chronic pancreatitis: Calcifications seen on CT scan.  -Monitor Hgb and for signs of active bleeding. Transfuse prn. -Started octreotide empirically 8/26, which could be continued for 72 hours. -Will need to restart diuretics when BP improved and can tolerate these (was on some as outpatient). Ascites management likely going to be difficult in this patient. -EGD for variceal screening inpatient vs outpatient.  She does have a GI MD in [place] who she saw for the first time recently and should continue to follow with him.    LOS: 3 days   ZEHR, JESSICA D.  12/17/2014, 9:19 AM  Pager number [phone]   [place] Attending  I have also seen and assessed the patient and agree with the advanced practitioner's assessment and plan.  I started IV furosemide and PO spironolactone (20 mg IV bid and 50 mg po qd). I think BP in a NL range for cirrhotic. Also trying pancreatic enzyme supplements.  EGD not urgent - could be deferred to outpatient I think. Further plans pending hospital course  Gatha Mayer, MD, [facility] [phone] (pager) 12/17/2014 12:59 PM

## 2014-12-17 NOTE — Care Management Important Message (Signed)
Important Message  Patient Details  Name: Marie Brooks MRN: [id number] Date of Birth: 22-Jul-1960   Medicare Important Message Given:  Yes-second notification given    Antony Haste, RN 12/17/2014, 1:47 PM

## 2014-12-17 NOTE — Progress Notes (Signed)
Progress Note   [name] SNK:[id number] DOB: 1960/10/16 DOA: 12/14/2014 PCP: Andrena Mews, NP   Brief Narrative:   Marie Brooks is an 54 y.o. female with a PMH of cirrhosis, hypertension, COPD, CHF (EF 55-60 percent by cath done 10/31/13) and ongoing alcohol abuse who was admitted 12/14/14 with a chief complaint of worsening dyspnea and abdominal bloating. A 4 L paracentesis was performed in the ED, after which she became hypotensive. She was also noted to have a hemoglobin of 5.5 mg/dL (recently evaluated in the ED for melena) and a temperature of 103F.  Assessment/Plan:   Principal Problem:   Sepsis secondary to Escherichia coli UTI with septic versus hemorrhagic shock - Sepsis criteria met with lactic acidosis fever, tachycardia, tachypnea and hypotension with source of infection: UTI. - Chest x-ray was clear. - Blood pressure responded to fluid bolus challenge and administration of blood. Currently hemodynamically stable. - Pancultured (blood, urine, sputum, ascitic fluid and stool culture sent as well as C. difficile PCR). - C. difficile PCR negative.  - Urine culture positive for Escherichia coli. Continue Rocephin. - Blood cultures and culture of ascitic fluid negative to date. - Hemodynamically stable, transfer to floor.  Active Problems:   Dyspnea - Given 1 dose of Lasix 12/16/14 after she became dyspneic, likely from fluid overload.    Hyperglycemia  - Follow-up hemoglobin A1c. CBGs 108-149 overnight, 267 this a.m.  Add moderate scale SSI.    Hernia of abdominal cavity - Not incarcerated. Evaluated by general surgery. No current indication for repair.    Chronic pancreatitis - Calcifications of pancreas noted on CT of the abdomen.    Acute blood loss anemia/symptomatic anemia secondary to GI bleeding/melena/heme positive stool - Given 1 unit of PRBCs 12/15/14. (Never received 2 units ordered in the ED as repeat hemoglobin was 8.9) - Continue octreotide. No  current recommendations for EGD per GI. - Hemoglobin remained stable at 9 mg/dL.    History of HTN (hypertension), hypotensive on admission / ? H/O CHF - Blood pressure stabilized after administration of albumin and fluid challenge, but remains low. - Antihypertensives including Coreg, Lasix, lisinopril and spironolactone on hold.  Given 1 dose of Lasix 12/16/14. - The patient had no evidence of systolic or diastolic dysfunction on 2-D echo done 10/31/13. - Unclear where the diagnosis of CHF was made, suspect she had pulmonary edema related to low albumin state.    Cirrhosis with Ascites / coagulopathy and thrombocytopenia secondary to cirrhosis in the setting of alcohol abuse - Ammonia not elevated. Lactulose on hold. Continue rifaximin.    Alcohol abuse - No reported alcohol intake since 10/20/14. - Ativan ordered as needed for withdrawal symptoms. - Continue Folvite and thiamine.    COPD / acute on chronic hypoxic respiratory failure - Continue Dulera, supplemental oxygen and bronchodilators. Resume Symbicort and Spiriva.    Hyponatremia - Sodium 136 today.    Hypocalcemia - Give another 2 g of calcium gluconate.    DVT Prophylaxis - SCDs only in the setting of probable GI bleeding.  Family Communication: No family at the bedside.  RN hasn't seen any either. Disposition Plan: Home when stable, likely several more days. Code Status:     Code Status Orders        Start     Ordered   12/15/14 0037  Full code   Continuous     12/15/14 0036        IV Access:    PIV   Procedures  and diagnostic studies:   Ct Abdomen Pelvis Wo Contrast  12/15/2014   CLINICAL DATA:  Umbilical hernia.  EXAM: CT ABDOMEN AND PELVIS WITHOUT CONTRAST  TECHNIQUE: Multidetector CT imaging of the abdomen and pelvis was performed following the standard protocol without IV contrast.  COMPARISON:  CT scan of October 24, 2013.  FINDINGS: Moderate degenerative disc disease is noted at L5-S1. Visualized  lung bases appear normal.  Small solitary gallstone is noted. No focal abnormality seen in the liver or spleen on these unenhanced images. Pancreatic calcifications are noted consistent with chronic pancreatitis. Adrenal glands and kidneys appear normal. No hydronephrosis or renal obstruction is noted. Moderate ascites is noted. No colonic dilatation is noted. The appendix appears normal. Mildly dilated small bowel loops are noted centrally within the abdomen most consistent with ileus. No definite transition zone is noted. Urinary bladder appears normal. Uterus and ovaries are unremarkable. Small fat containing periumbilical hernia is noted. There is no evidence of bowel incarceration. Mild anasarca is noted. No significant adenopathy is noted. Atherosclerosis of abdominal aorta is noted without aneurysm formation.  IMPRESSION: Moderate ascites is noted.  Atherosclerosis of abdominal aorta without aneurysm formation.  Pancreatic calcifications consistent with chronic pancreatitis.  Small solitary gallstone.  Mildly dilated small bowel loops are noted centrally in the abdomen most consistent with ileus. Continued radiographic follow-up is recommended.  Mild anasarca is noted.   Electronically Signed   By: Marijo Conception, M.D.   On: 12/15/2014 12:41     Medical Consultants:    Pulmonology/critical care: Corey Harold, NP  Anti-Infectives:    Cefotaxime, start date 8/25 >>>8/26  Zosyn, start date 8/25 >>>8/26  Vancomycin, start date 8/25 >>>8/26  Flagyl start date 8/25 >>>8/27  Rocephin 8/26 >>>  Subjective:   Marie Brooks reports some ongoing dyspnea.  Has pruritic skin around umbilical hernia.  Still having diarrhea.  Urinating frequently.  Wants the IV out of her neck.  Objective:    Filed Vitals:   12/17/14 0000 12/17/14 0141 12/17/14 0400 12/17/14 0500  BP: 112/63  95/52   Pulse: 93  79   Temp: 98.9 F (37.2 C)  98.6 F (37 C)   TempSrc: Axillary  Oral   Resp: 17  15     Height:      Weight:    77.2 kg (170 lb 3.1 oz)  SpO2: 99% 99% 99%     Intake/Output Summary (Last 24 hours) at 12/17/14 0719 Last data filed at 12/16/14 2245  Gross per 24 hour  Intake    155 ml  Output   1400 ml  Net  -1245 ml    Exam: Gen:  NAD, lethargic Cardiovascular:  Tachy, No M/R/G Respiratory:  Lungs diminished but clear Gastrointestinal:  Abdomen distended, tender with umbilical hernia Extremities:  1+ edema   Data Reviewed:    Labs: Basic Metabolic Panel:  Recent Labs Lab 12/14/14 2035 12/15/14 0037 12/15/14 0357 12/16/14 0353 12/17/14 0421  NA 132* 131* 133* 130* 136  K 3.8 3.2* 3.5 3.9 4.4  CL 96* 96* 97* 101 105  CO2 _0 GLUCOSE 103* 106* 120* 239* 107*  BUN _1 CREATININE 0.77 0.79 0.79 0.78 0.75  CALCIUM 7.9* 7.7* 8.1* 7.1* 7.4*   GFR Estimated Creatinine Clearance: 86 mL/min (by C-G formula based on Cr of 0.75). Liver Function Tests:  Recent Labs Lab 12/14/14 2035 12/15/14 0037 12/15/14 0357  AST 38 31 33  ALT 15  13* 12*  ALKPHOS 139* 127* 116  BILITOT 2.4* 2.9* 2.7*  PROT 6.9 6.8 6.3*  ALBUMIN 2.8* 3.1* 2.8*    Recent Labs Lab 12/15/14 0646  AMMONIA 28   Coagulation profile  Recent Labs Lab 12/14/14 2310 12/15/14 0357  INR 1.79* 1.80*    CBC:  Recent Labs Lab 12/14/14 2035 12/15/14 0037 12/15/14 0357 12/15/14 0646 12/15/14 1804 12/16/14 0353 12/17/14 0421  WBC 12.6* 8.5 9.4  --   --  5.2 5.9  NEUTROABS 9.4* 5.7  --   --   --   --   --   HGB 5.9* 8.9* 8.9* 8.6* 7.1* 7.9* 9.0*  HCT 18.4* 26.9* 25.8* 24.9* 22.6* 22.9* 26.5*  MCV 82.5 82.0 81.1  --   --  81.5 81.8  PLT 185 149* 164  --   --  114* 136*   Thyroid function studies:  Recent Labs  12/15/14 0117  TSH 0.748   CBG (last 3)   Recent Labs  12/16/14 1130 12/16/14 1608 12/16/14 2009  GLUCAP 108* 149* 132*     Sepsis Labs:  Recent Labs Lab 12/14/14 2045 12/14/14 2316 12/15/14 0037 12/15/14 0117 12/15/14 0357  12/15/14 0403 12/16/14 0353 12/17/14 0421  PROCALCITON  --   --  0.41  --  0.46  --  0.40 0.26  WBC  --   --  8.5  --  9.4  --  5.2 5.9  LATICACIDVEN 2.74* 1.03  --  1.4  --  1.9  --   --    Microbiology Recent Results (from the past 240 hour(s))  Urine culture     Status: None   Collection Time: 12/14/14  7:52 PM  Result Value Ref Range Status   Specimen Description URINE, CLEAN CATCH  Final   Special Requests NONE  Final   Culture   Final    >=100,000 COLONIES/mL ESCHERICHIA COLI Performed at [facility], [place]    Report Status 12/16/2014 FINAL  Final   Organism ID, Bacteria ESCHERICHIA COLI  Final      Susceptibility   Escherichia coli - MIC*    AMPICILLIN >=32 RESISTANT Resistant     CEFAZOLIN <=4 SENSITIVE Sensitive     CEFTRIAXONE <=1 SENSITIVE Sensitive     CIPROFLOXACIN <=0.25 SENSITIVE Sensitive     GENTAMICIN <=1 SENSITIVE Sensitive     IMIPENEM <=0.25 SENSITIVE Sensitive     NITROFURANTOIN <=16 SENSITIVE Sensitive     TRIMETH/SULFA >=320 RESISTANT Resistant     AMPICILLIN/SULBACTAM 16 INTERMEDIATE Intermediate     PIP/TAZO <=4 SENSITIVE Sensitive     * >=100,000 COLONIES/mL ESCHERICHIA COLI  Blood Culture (routine x 2)     Status: None (Preliminary result)   Collection Time: 12/14/14  8:35 PM  Result Value Ref Range Status   Specimen Description BLOOD EJ  Final   Special Requests BOTTLES DRAWN AEROBIC AND ANAEROBIC 5CC  Final   Culture   Final    NO GROWTH 1 DAY Performed at [facility]    Report Status PENDING  Incomplete  Blood Culture (routine x 2)     Status: None (Preliminary result)   Collection Time: 12/14/14  8:35 PM  Result Value Ref Range Status   Specimen Description BLOOD RIGHT HAND  Final   Special Requests BOTTLES DRAWN AEROBIC ONLY 5ML  Final   Culture   Final    NO GROWTH 2 DAYS Performed at [facility]    Report Status PENDING  Incomplete  Culture, body fluid-bottle  Status: None (Preliminary result)   Collection  Time: 12/14/14  9:53 PM  Result Value Ref Range Status   Specimen Description FLUID  Final   Special Requests NONE  Final   Culture   Final    NO GROWTH 1 DAY Performed at [facility]    Report Status PENDING  Incomplete  Gram stain     Status: None (Preliminary result)   Collection Time: 12/14/14  9:53 PM  Result Value Ref Range Status   Specimen Description FLUID  Final   Special Requests NONE  Final   Gram Stain   Final    RARE WBC PRESENT,BOTH PMN AND MONONUCLEAR NO ORGANISMS SEEN Performed at [facility]    Report Status PENDING  Incomplete  MRSA PCR Screening     Status: None   Collection Time: 12/15/14 12:20 AM  Result Value Ref Range Status   MRSA by PCR NEGATIVE NEGATIVE Final    Comment:        The GeneXpert MRSA Assay (FDA approved for NASAL specimens only), is one component of a comprehensive MRSA colonization surveillance program. It is not intended to diagnose MRSA infection nor to guide or monitor treatment for MRSA infections.   C difficile quick scan w PCR reflex     Status: None   Collection Time: 12/15/14 11:06 AM  Result Value Ref Range Status   C Diff antigen NEGATIVE NEGATIVE Final   C Diff toxin NEGATIVE NEGATIVE Final   C Diff interpretation Negative for toxigenic C. difficile  Final     Medications:   . sodium chloride   Intravenous Once  . albuterol  2.5 mg Nebulization Q6H  . antiseptic oral rinse  7 mL Mouth Rinse BID  . budesonide-formoterol  2 puff Inhalation BID  . folic acid  1 mg Oral Daily  . hydrocortisone cream  1 application Topical BID  . multivitamin with minerals  1 tablet Oral Daily  . pantoprazole  40 mg Oral Daily  . rifaximin  550 mg Oral BID  . sodium chloride  3 mL Intravenous Q12H  . thiamine  100 mg Oral Daily  . tiotropium  18 mcg Inhalation Daily   Continuous Infusions: . sodium chloride 10 mL/hr at 12/16/14 0858  . octreotide  (SANDOSTATIN)    IV infusion 50 mcg/hr (12/16/14 1600)     Time spent: 35 minutes.  The patient is medically complex and requires high complexity decision making.    LOS: 3 days   [facility] Pager [phone]. If unable to reach me by pager, please call my cell phone at [phone].  *Please refer to [email or web address], password [email or web address] to get updated schedule on who will round on this patient, as hospitalists switch teams weekly. If 7PM-7AM, please contact night-coverage at [email or web address], password [email or web address] for any overnight needs.  12/17/2014, 7:19 AM

## 2014-12-18 DIAGNOSIS — K429 Umbilical hernia without obstruction or gangrene: Secondary | ICD-10-CM | POA: Insufficient documentation

## 2014-12-18 LAB — PREPARE FRESH FROZEN PLASMA: Unit division: 0

## 2014-12-18 LAB — HEMOGLOBIN A1C
Hgb A1c MFr Bld: 5.2 % (ref 4.8–5.6)
MEAN PLASMA GLUCOSE: 103 mg/dL

## 2014-12-18 LAB — GLUCOSE, CAPILLARY
GLUCOSE-CAPILLARY: 88 mg/dL (ref 65–99)
GLUCOSE-CAPILLARY: 147 mg/dL — AB (ref 65–99)
Glucose-Capillary: 174 mg/dL — ABNORMAL HIGH (ref 65–99)
Glucose-Capillary: 76 mg/dL (ref 65–99)

## 2014-12-18 LAB — PATHOLOGIST SMEAR REVIEW

## 2014-12-18 MED ORDER — ALBUTEROL SULFATE (2.5 MG/3ML) 0.083% IN NEBU
2.5000 mg | INHALATION_SOLUTION | Freq: Three times a day (TID) | RESPIRATORY_TRACT | Status: DC
  Administered 2014-12-18 – 2014-12-21 (×9): 2.5 mg via RESPIRATORY_TRACT
  Filled 2014-12-18 (×9): qty 3

## 2014-12-18 MED ORDER — ALBUTEROL SULFATE (2.5 MG/3ML) 0.083% IN NEBU
2.5000 mg | INHALATION_SOLUTION | Freq: Four times a day (QID) | RESPIRATORY_TRACT | Status: DC
  Administered 2014-12-18 (×2): 2.5 mg via RESPIRATORY_TRACT
  Filled 2014-12-18 (×3): qty 3

## 2014-12-18 MED ORDER — FUROSEMIDE 40 MG PO TABS
40.0000 mg | ORAL_TABLET | Freq: Every day | ORAL | Status: DC
  Administered 2014-12-18 – 2014-12-19 (×2): 40 mg via ORAL
  Filled 2014-12-18 (×2): qty 1

## 2014-12-18 MED ORDER — SPIRONOLACTONE 100 MG PO TABS
100.0000 mg | ORAL_TABLET | Freq: Every day | ORAL | Status: DC
  Administered 2014-12-18 – 2014-12-19 (×2): 100 mg via ORAL
  Filled 2014-12-18 (×2): qty 1

## 2014-12-18 MED ORDER — FUROSEMIDE 40 MG PO TABS
40.0000 mg | ORAL_TABLET | Freq: Every day | ORAL | Status: DC
  Filled 2014-12-18: qty 1

## 2014-12-18 NOTE — Evaluation (Signed)
Physical Therapy Evaluation Patient Details Name: Marie Brooks MRN: [id number] DOB: Sep 07, 1960 Today's Date: 12/18/2014   History of Present Illness  54 yo female admitted with sepsis. Hx of COPD, CHF, HTN, chronic ETOH abuse, cirrhosis, ascites. Pt is from home alone.  Clinical Impression  On eval, pt required Min assist for mobility-performed stand pivot and took a few steps in room with RW. Pt fatigues easily. C/o dizziness during session so did not ambulate any farther. Mod encouragement for participation. Recommend daily mobility with nursing supervision/assist-pt should, at least, be OOB/walking to/from bathroom with assistance instead of use bedpan.     Follow Up Recommendations Home health PT;Supervision/Assistance - 24 hour    Equipment Recommendations  Rolling walker with 5" wheels    Recommendations for Other Services       Precautions / Restrictions Precautions Precautions: Fall Precaution Comments: O2 dep per pt Restrictions Weight Bearing Restrictions: No      Mobility  Bed Mobility Overal bed mobility: Needs Assistance Bed Mobility: Supine to Sit     Supine to sit: HOB elevated;Min guard     General bed mobility comments: Pt elevated HOB ~80 degrees. Moderate use of bedrails. Increased time  Transfers Overall transfer level: Needs assistance Equipment used: Rolling walker (2 wheeled) Transfers: Sit to/from [employer] Sit to Stand: Min assist Stand pivot transfers: Min assist       General transfer comment: Assist to rise, stabilize, control descent. Stand pivot from bed to [facility] with RW. Increased time. Pt reported dizziness.   Ambulation/Gait Ambulation/Gait assistance: Min assist Ambulation Distance (Feet): 3 Feet Assistive device: Rolling walker (2 wheeled) Gait Pattern/deviations: Step-through pattern;Decreased stride length;Trunk flexed     General Gait Details: Assist to stabilize pt and maneuver with RW. Remained on [place] O2. Pt  fatigues easily. Continued to c/o dizziness so did not ambulate any farther (also O2 tank not immediately available)  Stairs            Wheelchair Mobility    Modified Rankin (Stroke Patients Only)       Balance Overall balance assessment: Needs assistance         Standing balance support: Bilateral upper extremity supported;During functional activity Standing balance-Leahy Scale: Poor                               Pertinent Vitals/Pain Pain Assessment: 0-10 Pain Score: 9  Pain Location: abdomen    Home Living Family/patient expects to be discharged to:: Private residence Living Arrangements: Alone   Type of Home: Apartment       Home Layout: One level Home Equipment: Bedside commode      Prior Function Level of Independence: Independent         Comments: pt states she needs a [employer] took my walker"     Hand Dominance        Extremity/Trunk Assessment   Upper Extremity Assessment: Generalized weakness           Lower Extremity Assessment: Generalized weakness      Cervical / Trunk Assessment: Normal  Communication   Communication: No difficulties  Cognition Arousal/Alertness: Awake/alert Behavior During Therapy: WFL for tasks assessed/performed Overall Cognitive Status: Within Functional Limits for tasks assessed                      General Comments      Exercises  Assessment/Plan    PT Assessment Patient needs continued PT services  PT Diagnosis Difficulty walking;Generalized weakness;Acute pain   PT Problem List Decreased strength;Decreased range of motion;Decreased activity tolerance;Decreased balance;Decreased mobility;Pain;Decreased safety awareness  PT Treatment Interventions DME instruction;Gait training;Functional mobility training;Therapeutic activities;Patient/family education;Balance training;Therapeutic exercise   PT Goals (Current goals can be found in the Care  Plan section) Acute Rehab PT Goals Patient Stated Goal: less pain PT Goal Formulation: With patient Time For Goal Achievement: 01/01/15 Potential to Achieve Goals: Fair    Frequency Min 3X/week   Barriers to discharge        Co-evaluation               End of Session Equipment Utilized During Treatment: Oxygen Activity Tolerance: Patient limited by fatigue;Patient limited by pain Patient left: in chair;with call bell/phone within reach           Time: 1210-1230 PT Time Calculation (min) (ACUTE ONLY): 20 min   Charges:   PT Evaluation $Initial PT Evaluation Tier I: 1 Procedure     PT G Codes:        Rebeca Alert, MPT Pager: [phone]

## 2014-12-18 NOTE — Progress Notes (Signed)
[facility] Progress Note    Since last GI note: "urinating all night" since started diuretics yesterday.  Eating large breakfast this AM (grilled cheese, coke, fries). Has not been ambulating  Objective: Vital signs in last 24 hours: Temp:  [97.7 F (36.5 C)-98.5 F (36.9 C)] 98.2 F (36.8 C) (08/29 0525) Pulse Rate:  [76-93] 79 (08/29 0525) Resp:  [16-20] 18 (08/29 0525) BP: (93-108)/(55-77) 102/67 mmHg (08/29 0525) SpO2:  [99 %-100 %] 100 % (08/29 0525) Weight:  [162 lb 14.7 oz (73.9 kg)] 162 lb 14.7 oz (73.9 kg) (08/29 0525) Last BM Date: 12/17/14 General: alert and oriented times 3 Heart: regular rate and rythm Abdomen: soft, non-tender, non-distended, normal bowel sounds: + obvious ascites (moderate) No lower extremity edema. Net -1.3 liters past 24 hours  Lab Results:  Recent Labs  12/15/14 1804 12/16/14 0353 12/17/14 0421  WBC  --  5.2 5.9  HGB 7.1* 7.9* 9.0*  PLT  --  114* 136*  MCV  --  81.5 81.8    Recent Labs  12/16/14 0353 12/17/14 0421  NA 130* 136  K 3.9 4.4  CL 101 105  CO2 23 27  GLUCOSE 239* 107*  BUN 9 9  CREATININE 0.78 0.75  CALCIUM 7.1* 7.4*   Medications: Scheduled Meds: . sodium chloride   Intravenous Once  . albuterol  2.5 mg Nebulization QID  . antiseptic oral rinse  7 mL Mouth Rinse BID  . budesonide-formoterol  2 puff Inhalation BID  . folic acid  1 mg Oral Daily  . furosemide  20 mg Intravenous BID  . hydrocortisone cream  1 application Topical BID  . insulin aspart  0-15 Units Subcutaneous TID WC  . insulin aspart  0-5 Units Subcutaneous QHS  . lipase/protease/amylase  36,000 Units Oral TID WC  . multivitamin with minerals  1 tablet Oral Daily  . pantoprazole  40 mg Oral Daily  . rifaximin  550 mg Oral BID  . sodium chloride  3 mL Intravenous Q12H  . spironolactone  50 mg Oral Daily  . thiamine  100 mg Oral Daily  . tiotropium  18 mcg Inhalation Daily   Continuous Infusions: . sodium chloride 10 mL/hr  at 12/17/14 1836   PRN Meds:.albuterol, diphenhydrAMINE, guaifenesin, lipase/protease/amylase, ondansetron **OR** ondansetron (ZOFRAN) IV    Assessment/Plan: 54 y.o. female with etoh related cirrhosis and chronic calcific pancreatitis  Fluid management seems to be the main liver issue currently.  She was started on lasix IV  yesterday, aldactone 50 daily.  Will increase the aldactone to  daily and change the lasix to oral.  She needs to ambulate in halls.  Low salt diet.     Rachael Fee, MD  12/18/2014, 8:30 AM [facility] Pager [phone]

## 2014-12-18 NOTE — Progress Notes (Signed)
Nutrition Brief Note  Patient identified on the Malnutrition Screening Tool (MST) Report  Wt Readings from Last 15 Encounters:  12/18/14 162 lb 14.7 oz (73.9 kg)  11/02/13 160 lb 9.6 oz (72.848 kg)    Body mass index is 25.51 kg/(m^2). Patient meets criteria for overweight based on current BMI.   Current diet order is 2 gram Na, patient is consuming approximately 100% of meals at this time. Labs and medications reviewed.   No nutrition interventions warranted at this time. If nutrition issues arise, please consult RD.      Trenton Gammon, RD, LDN Inpatient Clinical Dietitian Pager # [phone] After hours/weekend pager # [phone]

## 2014-12-18 NOTE — Progress Notes (Signed)
Progress Note   Marie Brooks VOJ:[id number] DOB: May 06, 1960 DOA: 12/14/2014 PCP: Andrena Mews, NP   Brief Narrative:   Marie Brooks is an 54 y.o. female with a PMH of cirrhosis, hypertension, COPD, CHF (EF 55-60 percent by cath done 10/31/13) and ongoing alcohol abuse who was admitted 12/14/14 with a chief complaint of worsening dyspnea and abdominal bloating. A 4 L paracentesis was performed in the ED, after which she became hypotensive. She was also noted to have a hemoglobin of 5.5 mg/dL (recently evaluated in the ED for melena) and a temperature of 103F.  Assessment/Plan:   Principal Problem:   Sepsis secondary to Escherichia coli UTI with septic versus hemorrhagic shock - Sepsis criteria met with lactic acidosis fever, tachycardia, tachypnea and hypotension with source of infection: UTI. - Chest x-ray was clear. - Blood pressure responded to fluid bolus challenge and administration of blood. Currently hemodynamically stable. - Pancultured (blood, urine, sputum, ascitic fluid and stool culture sent as well as C. difficile PCR). - C. difficile PCR negative.  - Urine culture positive for Escherichia coli. Continue Rocephin. - Blood cultures and culture of ascitic fluid negative to date. - Mobilize.  Active Problems:   Dyspnea - Given 1 dose of Lasix 12/16/14 after she became dyspneic, likely from fluid overload. - Wean oxygen, saturations are 100%.    Hyperglycemia  - Follow-up hemoglobin A1c. CBGs 88-160, currently on moderate scale SSI.    Hernia of abdominal cavity - Not incarcerated. Evaluated by general surgery. No current indication for repair.    Chronic pancreatitis - Calcifications of pancreas noted on CT of the abdomen.    Acute blood loss anemia/symptomatic anemia secondary to GI bleeding/melena/heme positive stool - Given 1 unit of PRBCs 12/15/14. (Never received 2 units ordered in the ED as repeat hemoglobin was 8.9) - Continue octreotide. No current  recommendations for EGD per GI. - Hemoglobin stable at 9 mg/dL.    History of HTN (hypertension), hypotensive on admission / ? H/O CHF - Blood pressure stabilized after administration of albumin and fluid challenge, but remains low. - Antihypertensives including Coreg, Lasix, lisinopril and spironolactone initially held.  Given 1 dose of Lasix 12/16/14. - Lasix and spironolactone resumed 12/17/14. - The patient had no evidence of systolic or diastolic dysfunction on 2-D echo done 10/31/13. - Unclear where the diagnosis of CHF was made, suspect she had pulmonary edema related to low albumin state.    Cirrhosis with Ascites / coagulopathy and thrombocytopenia secondary to cirrhosis in the setting of alcohol abuse - Ammonia not elevated. Lactulose on hold. Continue rifaximin.    Alcohol abuse - No reported alcohol intake since 10/20/14. - Ativan ordered as needed for withdrawal symptoms. - Continue Folvite and thiamine.    COPD / acute on chronic hypoxic respiratory failure - Continue Dulera, supplemental oxygen and bronchodilators. Resume Symbicort and Spiriva.    Hyponatremia - Resolved.    Hypocalcemia - Treated with calcium gluconate 2.    DVT Prophylaxis - SCDs only in the setting of probable GI bleeding.  Family Communication: No family at the bedside.  RN hasn't seen any either. Disposition Plan: Home when stable, likely several more days. Code Status:     Code Status Orders        Start     Ordered   12/15/14 0037  Full code   Continuous     12/15/14 0036        IV Access:    PIV   Procedures and  diagnostic studies:   No results found.   Medical Consultants:    Pulmonology/critical care: Corey Harold, NP  Anti-Infectives:    Cefotaxime, start date 8/25 >>>8/26  Zosyn, start date 8/25 >>>8/26  Vancomycin, start date 8/25 >>>8/26  Flagyl start date 8/25 >>>8/27  Rocephin 8/26 >>>  Subjective:   [name] reports 9/10 abdominal pain.   "It feels like I have bricks in my stomach".  Dyspnea improved.  Feels bloated.  Still having diarrhea, 2-3 stools a day.  Objective:    Filed Vitals:   12/17/14 2137 12/17/14 2343 12/18/14 0000 12/18/14 0525  BP: 96/59 108/66 108/66 102/67  Pulse: 93 92 92 79  Temp: 98.5 F (36.9 C) 97.7 F (36.5 C)  98.2 F (36.8 C)  TempSrc: Oral Oral  Oral  Resp: _0 Height:      Weight:    73.9 kg (162 lb 14.7 oz)  SpO2: 100% 100%  100%    Intake/Output Summary (Last 24 hours) at 12/18/14 0810 Last data filed at 12/17/14 2103  Gross per 24 hour  Intake    415 ml  Output      0 ml  Net    415 ml    Exam: Gen:  NAD, awake/alert Cardiovascular:  Tachy, No M/R/G Respiratory:  Lungs diminished but clear Gastrointestinal:  Abdomen distended, tender with umbilical hernia Extremities:  1+ edema   Data Reviewed:    Labs: Basic Metabolic Panel:  Recent Labs Lab 12/14/14 2035 12/15/14 0037 12/15/14 0357 12/16/14 0353 12/17/14 0421  NA 132* 131* 133* 130* 136  K 3.8 3.2* 3.5 3.9 4.4  CL 96* 96* 97* 101 105  CO2 _1 GLUCOSE 103* 106* 120* 239* 107*  BUN _2 CREATININE 0.77 0.79 0.79 0.78 0.75  CALCIUM 7.9* 7.7* 8.1* 7.1* 7.4*   GFR Estimated Creatinine Clearance: 78.2 mL/min (by C-G formula based on Cr of 0.75). Liver Function Tests:  Recent Labs Lab 12/14/14 2035 12/15/14 0037 12/15/14 0357  AST 38 31 33  ALT 15 13* 12*  ALKPHOS 139* 127* 116  BILITOT 2.4* 2.9* 2.7*  PROT 6.9 6.8 6.3*  ALBUMIN 2.8* 3.1* 2.8*    Recent Labs Lab 12/15/14 0646  AMMONIA 28   Coagulation profile  Recent Labs Lab 12/14/14 2310 12/15/14 0357  INR 1.79* 1.80*    CBC:  Recent Labs Lab 12/14/14 2035 12/15/14 0037 12/15/14 0357 12/15/14 0646 12/15/14 1804 12/16/14 0353 12/17/14 0421  WBC 12.6* 8.5 9.4  --   --  5.2 5.9  NEUTROABS 9.4* 5.7  --   --   --   --   --   HGB 5.9* 8.9* 8.9* 8.6* 7.1* 7.9* 9.0*  HCT 18.4* 26.9* 25.8* 24.9* 22.6*  22.9* 26.5*  MCV 82.5 82.0 81.1  --   --  81.5 81.8  PLT 185 149* 164  --   --  114* 136*   Thyroid function studies: No results for input(s): TSH, T4TOTAL, T3FREE, THYROIDAB in the last 72 hours.  Invalid input(s): FREET3 CBG (last 3)   Recent Labs  12/17/14 1705 12/17/14 2212 12/18/14 0800  GLUCAP 132* 160* 88     Sepsis Labs:  Recent Labs Lab 12/14/14 2045 12/14/14 2316 12/15/14 0037 12/15/14 0117 12/15/14 0357 12/15/14 0403 12/16/14 0353 12/17/14 0421  PROCALCITON  --   --  0.41  --  0.46  --  0.40 0.26  WBC  --   --  8.5  --  9.4  --  5.2 5.9  LATICACIDVEN 2.74* 1.03  --  1.4  --  1.9  --   --    Microbiology Recent Results (from the past 240 hour(s))  Urine culture     Status: None   Collection Time: 12/14/14  7:52 PM  Result Value Ref Range Status   Specimen Description URINE, CLEAN CATCH  Final   Special Requests NONE  Final   Culture   Final    >=100,000 COLONIES/mL ESCHERICHIA COLI Performed at [facility]    Report Status 12/16/2014 FINAL  Final   Organism ID, Bacteria ESCHERICHIA COLI  Final      Susceptibility   Escherichia coli - MIC*    AMPICILLIN >=32 RESISTANT Resistant     CEFAZOLIN <=4 SENSITIVE Sensitive     CEFTRIAXONE <=1 SENSITIVE Sensitive     CIPROFLOXACIN <=0.25 SENSITIVE Sensitive     GENTAMICIN <=1 SENSITIVE Sensitive     IMIPENEM <=0.25 SENSITIVE Sensitive     NITROFURANTOIN <=16 SENSITIVE Sensitive     TRIMETH/SULFA >=320 RESISTANT Resistant     AMPICILLIN/SULBACTAM 16 INTERMEDIATE Intermediate     PIP/TAZO <=4 SENSITIVE Sensitive     * >=100,000 COLONIES/mL ESCHERICHIA COLI  Blood Culture (routine x 2)     Status: None (Preliminary result)   Collection Time: 12/14/14  8:35 PM  Result Value Ref Range Status   Specimen Description BLOOD EJ  Final   Special Requests BOTTLES DRAWN AEROBIC AND ANAEROBIC 5CC  Final   Culture   Final    NO GROWTH 2 DAYS Performed at [facility]    Report Status PENDING   Incomplete  Blood Culture (routine x 2)     Status: None (Preliminary result)   Collection Time: 12/14/14  8:35 PM  Result Value Ref Range Status   Specimen Description BLOOD RIGHT HAND  Final   Special Requests BOTTLES DRAWN AEROBIC ONLY 5ML  Final   Culture   Final    NO GROWTH 3 DAYS Performed at [facility]    Report Status PENDING  Incomplete  Culture, body fluid-bottle     Status: None (Preliminary result)   Collection Time: 12/14/14  9:53 PM  Result Value Ref Range Status   Specimen Description FLUID  Final   Special Requests NONE  Final   Culture   Final    NO GROWTH 2 DAYS Performed at [facility]    Report Status PENDING  Incomplete  Gram stain     Status: None (Preliminary result)   Collection Time: 12/14/14  9:53 PM  Result Value Ref Range Status   Specimen Description FLUID  Final   Special Requests NONE  Final   Gram Stain   Final    RARE WBC PRESENT,BOTH PMN AND MONONUCLEAR NO ORGANISMS SEEN Performed at [facility]    Report Status PENDING  Incomplete  MRSA PCR Screening     Status: None   Collection Time: 12/15/14 12:20 AM  Result Value Ref Range Status   MRSA by PCR NEGATIVE NEGATIVE Final    Comment:        The GeneXpert MRSA Assay (FDA approved for NASAL specimens only), is one component of a comprehensive MRSA colonization surveillance program. It is not intended to diagnose MRSA infection nor to guide or monitor treatment for MRSA infections.   C difficile quick scan w PCR reflex     Status: None   Collection Time: 12/15/14 11:06 AM  Result Value Ref Range Status   C Diff antigen NEGATIVE NEGATIVE Final   C Diff toxin NEGATIVE NEGATIVE Final   C Diff interpretation Negative for toxigenic C. difficile  Final  Stool culture     Status: None (Preliminary result)   Collection Time: 12/15/14 11:06 AM  Result Value Ref Range Status   Specimen Description STOOL  Final   Special Requests NONE  Final   Culture   Final     NO SUSPICIOUS COLONIES, CONTINUING TO HOLD Note: REDUCED NORMAL FLORA PRESENT Performed at [facility]    Report Status PENDING  Incomplete     Medications:   . sodium chloride   Intravenous Once  . albuterol  2.5 mg Nebulization QID  . antiseptic oral rinse  7 mL Mouth Rinse BID  . budesonide-formoterol  2 puff Inhalation BID  . folic acid  1 mg Oral Daily  . furosemide  20 mg Intravenous BID  . hydrocortisone cream  1 application Topical BID  . insulin aspart  0-15 Units Subcutaneous TID WC  . insulin aspart  0-5 Units Subcutaneous QHS  . lipase/protease/amylase  36,000 Units Oral TID WC  . multivitamin with minerals  1 tablet Oral Daily  . pantoprazole  40 mg Oral Daily  . rifaximin  550 mg Oral BID  . sodium chloride  3 mL Intravenous Q12H  . spironolactone  50 mg Oral Daily  . thiamine  100 mg Oral Daily  . tiotropium  18 mcg Inhalation Daily   Continuous Infusions: . sodium chloride 10 mL/hr at 12/17/14 1836    Time spent: 25 minutes.    LOS: 4 days   [place] [facility] Pager [phone]. If unable to reach me by pager, please call my cell phone at [phone].  *Please refer to [email or web address], password [email or web address] to get updated schedule on who will round on this patient, as hospitalists switch teams weekly. If 7PM-7AM, please contact night-coverage at [email or web address], password [email or web address] for any overnight needs.  12/18/2014, 8:10 AM

## 2014-12-19 DIAGNOSIS — I9589 Other hypotension: Secondary | ICD-10-CM

## 2014-12-19 LAB — TYPE AND SCREEN
ABO/RH(D): O POS
Antibody Screen: NEGATIVE
UNIT DIVISION: 0
Unit division: 0

## 2014-12-19 LAB — BASIC METABOLIC PANEL
Anion gap: 4 — ABNORMAL LOW (ref 5–15)
BUN: 8 mg/dL (ref 6–20)
CALCIUM: 7.3 mg/dL — AB (ref 8.9–10.3)
CHLORIDE: 101 mmol/L (ref 101–111)
CO2: 30 mmol/L (ref 22–32)
CREATININE: 0.56 mg/dL (ref 0.44–1.00)
Glucose, Bld: 99 mg/dL (ref 65–99)
Potassium: 3.7 mmol/L (ref 3.5–5.1)
SODIUM: 135 mmol/L (ref 135–145)

## 2014-12-19 LAB — GLUCOSE, CAPILLARY: Glucose-Capillary: 73 mg/dL (ref 65–99)

## 2014-12-19 LAB — STOOL CULTURE

## 2014-12-19 LAB — CULTURE, BLOOD (ROUTINE X 2): Culture: NO GROWTH

## 2014-12-19 MED ORDER — SIMETHICONE 80 MG PO CHEW
80.0000 mg | CHEWABLE_TABLET | Freq: Four times a day (QID) | ORAL | Status: DC
  Administered 2014-12-19 – 2014-12-21 (×8): 80 mg via ORAL
  Filled 2014-12-19 (×11): qty 1

## 2014-12-19 MED ORDER — FUROSEMIDE 40 MG PO TABS
40.0000 mg | ORAL_TABLET | Freq: Two times a day (BID) | ORAL | Status: DC
  Administered 2014-12-19 – 2014-12-21 (×4): 40 mg via ORAL
  Filled 2014-12-19 (×6): qty 1

## 2014-12-19 MED ORDER — SPIRONOLACTONE 100 MG PO TABS
100.0000 mg | ORAL_TABLET | Freq: Two times a day (BID) | ORAL | Status: DC
  Administered 2014-12-19 – 2014-12-21 (×4): 100 mg via ORAL
  Filled 2014-12-19 (×6): qty 1

## 2014-12-19 NOTE — Clinical Social Work Note (Signed)
Clinical Social Work Assessment  Patient Details  Name: Marie Brooks MRN: [id number] Date of Birth: Sep 27, 1960  Date of referral:  12/19/14               Reason for consult:  Discharge Planning, Transportation                Permission sought to share information with:    Permission granted to share information::  Yes, Verbal Permission Granted  Name::     Product manager::     Relationship::  mom  Contact Information:     Housing/Transportation Living arrangements for the past 2 months:  Apartment Source of Information:  Patient Patient Interpreter Needed:  None Criminal Activity/Legal Involvement Pertinent to Current Situation/Hospitalization:  No - Comment as needed Significant Relationships:  None Lives with:  Self Do you feel safe going back to the place where you live?  Yes Need for family participation in patient care:  No (Coment)  Care giving concerns:  Patient reports she lives alone and does not have 24 hour supervision.   Social Worker assessment / plan:  CSW received referral to assist with transportation and DC needs. CSW reviewed chart which stated that PT recommends HH with 24 hour supervision. Patient lives home alone and reports she has a big family but they are unable to assist her. Patient has never been to SNF in the past and reports she would prefer to return home if possible. Patient reports if breathing problems continue then she might consider SNF placement but feels most comfortable in her home. Patient reports she ambulates with a walker and if she is able to equipment ordered then she feels she can return home. Since patient is undecided, CSW spoke with patient re: SNF search in case she feels deconditioned at time of DC. CSW left SNF list and patient agreeable to [facility] search.  CSW completed FL2 and faxed out. Patient reports if she decides to return home with [facility] then she will need assistance with transportation home.  Employment status:  Disabled  (Comment on whether or not currently receiving Disability) Insurance information:  Medicaid In [place], [place] PT Recommendations:  Home with Home Health, 24 Hour Supervision Information / Referral to community resources:  Skilled Nursing Facility  Patient/Family's Response to care:  Patient has flat affect and is undecided about her plans at DC.  Patient/Family's Understanding of and Emotional Response to Diagnosis, Current Treatment, and Prognosis:  Patient reports that problems are acute problems and once she is DC from the hospital she plans to return to baseline quickly.   Emotional Assessment Appearance:  Appears older than stated age Attitude/Demeanor/Rapport:  Other (Cooperative) Affect (typically observed):  Flat Orientation:  Oriented to Self, Oriented to Place, Oriented to  Time, Oriented to Situation Alcohol / Substance use:  Other (Patient has a hx of alcohol abuse but denies any current use) Psych involvement (Current and /or in the community):  No (Comment)  Discharge Needs  Concerns to be addressed:  No discharge needs identified Readmission within the last 30 days:  No Current discharge risk:  None Barriers to Discharge:  No Barriers Identified   Marnee Spring, LCSW 12/19/2014, 3:44 PM [phone]

## 2014-12-19 NOTE — Clinical Social Work Placement (Addendum)
   CLINICAL SOCIAL WORK PLACEMENT  NOTE  Date:  12/19/2014  Patient Details  Name: Marie Brooks MRN: [id number] Date of Birth: 1960/04/24  Clinical Social Work is seeking post-discharge placement for this patient at the Skilled  Nursing Facility level of care (*CSW will initial, date and re-position this form in  chart as items are completed):  Yes   Patient/family provided with [place] Clinical Social Work Department's list of facilities offering this level of care within the geographic area requested by the patient (or if unable, by the patient's family).  Yes   Patient/family informed of their freedom to choose among providers that offer the needed level of care, that participate in Medicare, Medicaid or managed care program needed by the patient, have an available bed and are willing to accept the patient.  Yes   Patient/family informed of [facility] ownership interest in [facility] and [facility], as well as of the fact that they are under no obligation to receive care at these facilities.  PASRR submitted to EDS on 12/19/14     PASRR number received on 12/19/14     Existing PASRR number confirmed on       FL2 transmitted to all facilities in geographic area requested by pt/family on 12/19/14     FL2 transmitted to all facilities within larger geographic area on       Patient informed that his/her managed care company has contracts with or will negotiate with certain facilities, including the following:            Patient/family informed of bed offers received.  Patient chooses bed at       Physician recommends and patient chooses bed at      Patient to be transferred to   on  .  Patient to be transferred to facility by       Patient family notified on   of transfer.  Name of family member notified:        PHYSICIAN       Additional Comment:    _______________________________________________ Marnee Spring, LCSW 12/19/2014, 3:47  PM   12/21/14-Patient decided to return home with [facility]. Taxi voucher provided due to no family being able to assist with transportation. Patient unable to take bus safely according to MD due to oxygen needs.

## 2014-12-19 NOTE — Progress Notes (Addendum)
Discussed patient low BP of SBP in 90's with Dr. Christella Hartigan. Will give lasix and aldactone as ordered.

## 2014-12-19 NOTE — Progress Notes (Signed)
Progress Note   Marie Brooks PFX:[id number] DOB: October 11, 1960 DOA: 12/14/2014 PCP: Andrena Mews, NP   Brief Narrative:   Marie Brooks is an 54 y.o. female with a PMH of cirrhosis, hypertension, COPD, CHF (EF 55-60 percent by cath done 10/31/13) and ongoing alcohol abuse who was admitted 12/14/14 with a chief complaint of worsening dyspnea and abdominal bloating. A 4 L paracentesis was performed in the ED, after which she became hypotensive. She was also noted to have a hemoglobin of 5.5 mg/dL (recently evaluated in the ED for melena) and a temperature of 103F.  Assessment/Plan:   Principal Problem:   Sepsis secondary to Escherichia coli UTI with septic versus hemorrhagic shock - Sepsis criteria met with lactic acidosis fever, tachycardia, tachypnea and hypotension with source of infection: UTI. - Chest x-ray was clear. - Blood pressure responded to fluid bolus challenge and administration of blood. Currently hemodynamically stable. - Pancultured (blood, urine, sputum, ascitic fluid and stool culture sent as well as C. difficile PCR). - C. difficile PCR negative. Stool cultures negative. - Urine culture positive for Escherichia coli. Continue Rocephin. - Blood cultures and culture of ascitic fluid negative to date. - Mobilize.  Active Problems:   Dyspnea - Given 1 dose of Lasix 12/16/14 after she became dyspneic, likely from fluid overload. - Wean oxygen, saturations are 100%.    Hyperglycemia  - Hemoglobin A1c 5.2%. Discontinue CBG checks/SSI.Marland Kitchen    Hernia of abdominal cavity - Not incarcerated. Evaluated by general surgery. No current indication for repair.    Chronic pancreatitis - Calcifications of pancreas noted on CT of the abdomen.    Acute blood loss anemia/symptomatic anemia secondary to GI bleeding/melena/heme positive stool - Given 1 unit of PRBCs 12/15/14. (Never received 2 units ordered in the ED as repeat hemoglobin was 8.9) - Continue octreotide. No current  recommendations for EGD per GI. - Hemoglobin stable at 9 mg/dL.    History of HTN (hypertension), hypotensive on admission / ? H/O CHF - Blood pressure stabilized after administration of albumin and fluid challenge, but remains low. - Antihypertensives including Coreg, Lasix, lisinopril and spironolactone initially held.  Given 1 dose of Lasix 12/16/14. - Lasix and spironolactone resumed 12/17/14. - The patient had no evidence of systolic or diastolic dysfunction on 2-D echo done 10/31/13. - Unclear where the diagnosis of CHF was made, suspect she had pulmonary edema related to low albumin state.    Cirrhosis with Ascites / coagulopathy and thrombocytopenia secondary to cirrhosis in the setting of alcohol abuse - Ammonia not elevated. Lactulose on hold. Continue rifaximin. - Diuretics being adjusted by GI.    Alcohol abuse - No reported alcohol intake since 10/20/14. - Ativan ordered as needed for withdrawal symptoms. - Continue Folvite and thiamine.    COPD / acute on chronic hypoxic respiratory failure - Continue Dulera, bronchodilators, Symbicort and Spiriva.    Hyponatremia - Resolved.    Hypocalcemia - Treated with calcium gluconate 2.    DVT Prophylaxis - SCDs only in the setting of probable GI bleeding.  Family Communication: No family at the bedside.  RN hasn't seen any either. Disposition Plan: Home when stable, possibly 1-2 more days. Code Status:     Code Status Orders        Start     Ordered   12/15/14 0037  Full code   Continuous     12/15/14 0036        IV Access:    PIV   Procedures and  diagnostic studies:   No results found.   Medical Consultants:    Pulmonology/critical care: Corey Harold, NP  Anti-Infectives:    Cefotaxime, start date 8/25 >>>8/26  Zosyn, start date 8/25 >>>8/26  Vancomycin, start date 8/25 >>>8/26  Flagyl start date 8/25 >>>8/27  Rocephin 8/26 >>>  Subjective:   Marie Brooks reports 10/10 abdominal pain.   Feels "gassy".  Appetite "OK", but doesn't like her diet.  Feels short of breath.  Objective:    Filed Vitals:   12/18/14 2005 12/18/14 2006 12/18/14 2100 12/19/14 0455  BP:   100/55 92/54  Pulse:   92 85  Temp:   98.2 F (36.8 C) 98.1 F (36.7 C)  TempSrc:   Oral Oral  Resp:   18 18  Height:      Weight:      SpO2: 100% 100% 98% 100%    Intake/Output Summary (Last 24 hours) at 12/19/14 0827 Last data filed at 12/19/14 0500  Gross per 24 hour  Intake 873.17 ml  Output      2 ml  Net 871.17 ml    Exam: Gen:  NAD, awake/alert Cardiovascular:  Tachy, No M/R/G Respiratory:  Lungs diminished but clear Gastrointestinal:  Abdomen distended, tender with umbilical hernia Extremities:  1+ edema   Data Reviewed:    Labs: Basic Metabolic Panel:  Recent Labs Lab 12/15/14 0037 12/15/14 0357 12/16/14 0353 12/17/14 0421 12/19/14 0525  NA 131* 133* 130* 136 135  K 3.2* 3.5 3.9 4.4 3.7  CL 96* 97* 101 105 101  CO2 _0 GLUCOSE 106* 120* 239* 107* 99  BUN _1 CREATININE 0.79 0.79 0.78 0.75 0.56  CALCIUM 7.7* 8.1* 7.1* 7.4* 7.3*   GFR Estimated Creatinine Clearance: 78.2 mL/min (by C-G formula based on Cr of 0.56). Liver Function Tests:  Recent Labs Lab 12/14/14 2035 12/15/14 0037 12/15/14 0357  AST 38 31 33  ALT 15 13* 12*  ALKPHOS 139* 127* 116  BILITOT 2.4* 2.9* 2.7*  PROT 6.9 6.8 6.3*  ALBUMIN 2.8* 3.1* 2.8*    Recent Labs Lab 12/15/14 0646  AMMONIA 28   Coagulation profile  Recent Labs Lab 12/14/14 2310 12/15/14 0357  INR 1.79* 1.80*    CBC:  Recent Labs Lab 12/14/14 2035 12/15/14 0037 12/15/14 0357 12/15/14 0646 12/15/14 1804 12/16/14 0353 12/17/14 0421  WBC 12.6* 8.5 9.4  --   --  5.2 5.9  NEUTROABS 9.4* 5.7  --   --   --   --   --   HGB 5.9* 8.9* 8.9* 8.6* 7.1* 7.9* 9.0*  HCT 18.4* 26.9* 25.8* 24.9* 22.6* 22.9* 26.5*  MCV 82.5 82.0 81.1  --   --  81.5 81.8  PLT 185 149* 164  --   --  114* 136*   Thyroid  function studies: No results for input(s): TSH, T4TOTAL, T3FREE, THYROIDAB in the last 72 hours.  Invalid input(s): FREET3 CBG (last 3)   Recent Labs  12/18/14 1641 12/18/14 2101 12/19/14 0747  GLUCAP 76 147* 73     Sepsis Labs:  Recent Labs Lab 12/14/14 2045 12/14/14 2316 12/15/14 0037 12/15/14 0117 12/15/14 0357 12/15/14 0403 12/16/14 0353 12/17/14 0421  PROCALCITON  --   --  0.41  --  0.46  --  0.40 0.26  WBC  --   --  8.5  --  9.4  --  5.2 5.9  LATICACIDVEN 2.74* 1.03  --  1.4  --  1.9  --   --    Microbiology Recent Results (from the past 240 hour(s))  Urine culture     Status: None   Collection Time: 12/14/14  7:52 PM  Result Value Ref Range Status   Specimen Description URINE, CLEAN CATCH  Final   Special Requests NONE  Final   Culture   Final    >=100,000 COLONIES/mL ESCHERICHIA COLI Performed at [facility]    Report Status 12/16/2014 FINAL  Final   Organism ID, Bacteria ESCHERICHIA COLI  Final      Susceptibility   Escherichia coli - MIC*    AMPICILLIN >=32 RESISTANT Resistant     CEFAZOLIN <=4 SENSITIVE Sensitive     CEFTRIAXONE <=1 SENSITIVE Sensitive     CIPROFLOXACIN <=0.25 SENSITIVE Sensitive     GENTAMICIN <=1 SENSITIVE Sensitive     IMIPENEM <=0.25 SENSITIVE Sensitive     NITROFURANTOIN <=16 SENSITIVE Sensitive     TRIMETH/SULFA >=320 RESISTANT Resistant     AMPICILLIN/SULBACTAM 16 INTERMEDIATE Intermediate     PIP/TAZO <=4 SENSITIVE Sensitive     * >=100,000 COLONIES/mL ESCHERICHIA COLI  Blood Culture (routine x 2)     Status: None (Preliminary result)   Collection Time: 12/14/14  8:35 PM  Result Value Ref Range Status   Specimen Description BLOOD EJ  Final   Special Requests BOTTLES DRAWN AEROBIC AND ANAEROBIC 5CC  Final   Culture   Final    NO GROWTH 3 DAYS Performed at [facility]    Report Status PENDING  Incomplete  Blood Culture (routine x 2)     Status: None (Preliminary result)   Collection Time: 12/14/14   8:35 PM  Result Value Ref Range Status   Specimen Description BLOOD RIGHT HAND  Final   Special Requests BOTTLES DRAWN AEROBIC ONLY 5ML  Final   Culture   Final    NO GROWTH 4 DAYS Performed at [facility]    Report Status PENDING  Incomplete  Culture, body fluid-bottle     Status: None (Preliminary result)   Collection Time: 12/14/14  9:53 PM  Result Value Ref Range Status   Specimen Description FLUID  Final   Special Requests NONE  Final   Culture   Final    NO GROWTH 3 DAYS Performed at [facility]    Report Status PENDING  Incomplete  Gram stain     Status: None (Preliminary result)   Collection Time: 12/14/14  9:53 PM  Result Value Ref Range Status   Specimen Description FLUID  Final   Special Requests NONE  Final   Gram Stain   Final    RARE WBC PRESENT,BOTH PMN AND MONONUCLEAR NO ORGANISMS SEEN Performed at [facility]    Report Status PENDING  Incomplete  MRSA PCR Screening     Status: None   Collection Time: 12/15/14 12:20 AM  Result Value Ref Range Status   MRSA by PCR NEGATIVE NEGATIVE Final    Comment:        The GeneXpert MRSA Assay (FDA approved for NASAL specimens only), is one component of a comprehensive MRSA colonization surveillance program. It is not intended to diagnose MRSA infection nor to guide or monitor treatment for MRSA infections.   C difficile quick scan w PCR reflex     Status: None   Collection Time: 12/15/14 11:06 AM  Result Value Ref Range Status   C Diff antigen NEGATIVE NEGATIVE Final   C Diff toxin NEGATIVE NEGATIVE  Final   C Diff interpretation Negative for toxigenic C. difficile  Final  Stool culture     Status: None   Collection Time: 12/15/14 11:06 AM  Result Value Ref Range Status   Specimen Description STOOL  Final   Special Requests NONE  Final   Culture   Final    NO SALMONELLA, SHIGELLA, CAMPYLOBACTER, YERSINIA, OR E.COLI [id number] ISOLATED Note: REDUCED NORMAL FLORA PRESENT Performed at  [facility]    Report Status 12/19/2014 FINAL  Final     Medications:   . albuterol  2.5 mg Nebulization TID  . antiseptic oral rinse  7 mL Mouth Rinse BID  . budesonide-formoterol  2 puff Inhalation BID  . folic acid  1 mg Oral Daily  . furosemide  40 mg Oral Daily  . hydrocortisone cream  1 application Topical BID  . insulin aspart  0-15 Units Subcutaneous TID WC  . insulin aspart  0-5 Units Subcutaneous QHS  . lipase/protease/amylase  36,000 Units Oral TID WC  . multivitamin with minerals  1 tablet Oral Daily  . pantoprazole  40 mg Oral Daily  . rifaximin  550 mg Oral BID  . sodium chloride  3 mL Intravenous Q12H  . spironolactone  100 mg Oral Daily  . thiamine  100 mg Oral Daily  . tiotropium  18 mcg Inhalation Daily   Continuous Infusions: . sodium chloride 10 mL/hr at 12/17/14 1836    Time spent: 25 minutes.    LOS: 5 days   [place] [facility] Pager [phone]. If unable to reach me by pager, please call my cell phone at [phone].  *Please refer to [email or web address], password [email or web address] to get updated schedule on who will round on this patient, as hospitalists switch teams weekly. If 7PM-7AM, please contact night-coverage at [email or web address], password [email or web address] for any overnight needs.  12/19/2014, 8:27 AM

## 2014-12-19 NOTE — Progress Notes (Signed)
    Progress Note   Subjective  urinating a lot. No complaints   Objective   Vital signs in last 24 hours: Temp:  [98.1 F (36.7 C)-98.2 F (36.8 C)] 98.1 F (36.7 C) (08/30 0455) Pulse Rate:  [84-92] 85 (08/30 0455) Resp:  [18-19] 18 (08/30 0455) BP: (92-100)/(54-55) 92/54 mmHg (08/30 0455) SpO2:  [98 %-100 %] 100 % (08/30 0455) Last BM Date: 12/19/14 General:    Pleasant black female in NAD Heart:  Regular rate and rhythm Abdomen:  Soft, distended, large umbilical hernia (tender). Normal bowel sounds. Extremities:  Without edema. Neurologic:  Alert and oriented,  grossly normal neurologically. Psych:  Cooperative. Normal mood and affect.    Lab Results:  Recent Labs  12/17/14 0421  WBC 5.9  HGB 9.0*  HCT 26.5*  PLT 136*   BMET  Recent Labs  12/17/14 0421 12/19/14 0525  NA 136 135  K 4.4 3.7  CL 105 101  CO2 27 30  GLUCOSE 107* 99  BUN 9 8  CREATININE 0.75 0.56  CALCIUM 7.4* 7.3*      Assessment / Plan:   1. ETOH cirrhosis and chronic calcific pancreatitis. Admitted with SOB / ascites. She had a LVP a few days ago, became hypotensive after 4 liters. Still with significant ascites. On  of lasix and  of aldactone daily. On 2gm sodium diet. Normal renal function. Continue present course.   2. Anemia, heme positive stools. No overt bleeding. Hgb stable at 9 post 2 units of blood.     LOS: 5 days   Willette Cluster  12/19/2014, 8:59 AM   ________________________________________________________________________  [facility] MD note:  I personally examined the patient, reviewed the data and agree with the assessment and plan described above.  She was initially declining her diuretics this morning because "they aren't helping" however continues to complain about so much urine output.  I/Os not being charted strictly, but I've asked that that be changed so we can more closely monitor the diuretic impact.  Will increase to 200 aldactone,  lasix  daily. She asked about paracentesis however I am reluctant after her hypotensive event after 4 liter tap earlier this admission.   Rob Bunting, MD [facility] Pager [phone]

## 2014-12-20 DIAGNOSIS — N390 Urinary tract infection, site not specified: Secondary | ICD-10-CM

## 2014-12-20 DIAGNOSIS — A419 Sepsis, unspecified organism: Secondary | ICD-10-CM | POA: Insufficient documentation

## 2014-12-20 LAB — BASIC METABOLIC PANEL
Anion gap: 7 (ref 5–15)
BUN: 8 mg/dL (ref 6–20)
CALCIUM: 7.7 mg/dL — AB (ref 8.9–10.3)
CO2: 30 mmol/L (ref 22–32)
CREATININE: 0.64 mg/dL (ref 0.44–1.00)
Chloride: 99 mmol/L — ABNORMAL LOW (ref 101–111)
Glucose, Bld: 107 mg/dL — ABNORMAL HIGH (ref 65–99)
Potassium: 4.1 mmol/L (ref 3.5–5.1)
SODIUM: 136 mmol/L (ref 135–145)

## 2014-12-20 LAB — CBC
HCT: 27.1 % — ABNORMAL LOW (ref 36.0–46.0)
Hemoglobin: 9.1 g/dL — ABNORMAL LOW (ref 12.0–15.0)
MCH: 27.4 pg (ref 26.0–34.0)
MCHC: 33.6 g/dL (ref 30.0–36.0)
MCV: 81.6 fL (ref 78.0–100.0)
PLATELETS: 175 10*3/uL (ref 150–400)
RBC: 3.32 MIL/uL — AB (ref 3.87–5.11)
RDW: 18 % — AB (ref 11.5–15.5)
WBC: 7.4 10*3/uL (ref 4.0–10.5)

## 2014-12-20 LAB — CULTURE, BODY FLUID-BOTTLE: CULTURE: NO GROWTH

## 2014-12-20 LAB — CULTURE, BODY FLUID W GRAM STAIN -BOTTLE

## 2014-12-20 LAB — CULTURE, BLOOD (ROUTINE X 2): Culture: NO GROWTH

## 2014-12-20 NOTE — Progress Notes (Signed)
Physical Therapy Treatment Patient Details Name: Marie Brooks MRN: [id number] DOB: 08-20-1960 Today's Date: 12/20/2014    History of Present Illness 54 yo female admitted with sepsis. Hx of COPD, CHF, HTN, chronic ETOH abuse, cirrhosis, ascites. Pt is from home alone.    PT Comments    Progressing well with mobility. Pt performed well on today. Fatigue and dyspnea noted after first ~100 feet ambulation distance. Dyspnea 3/4. Continue to recommend HHPT.   Follow Up Recommendations  Home health PT     Equipment Recommendations  Rolling walker with 5" wheels    Recommendations for Other Services       Precautions / Restrictions Precautions Precautions: Fall Precaution Comments: O2 dep per pt Restrictions Weight Bearing Restrictions: No    Mobility  Bed Mobility Overal bed mobility: Modified Independent             General bed mobility comments: Pt elevated HOB ~30 degrees. Moderate use of bedrails. Increased time  Transfers Overall transfer level: Needs assistance Equipment used: Rolling walker (2 wheeled) Transfers: Sit to/from Stand Sit to Stand: Supervision         General transfer comment: supervision for safety. VCs hand placement.   Ambulation/Gait Ambulation/Gait assistance: Min guard Ambulation Distance (Feet): 100 Feet (100'x2, 15'x2) Assistive device: Rolling walker (2 wheeled) Gait Pattern/deviations: Step-through pattern;Decreased stride length     General Gait Details: Good gait speed. Remained on 2L O2. Dyspnea 3/4. Standing rest break needed/taken. close guard for safety. Pt did not rely much on walker but used it for safety   Stairs            Wheelchair Mobility    Modified Rankin (Stroke Patients Only)       Balance           Standing balance support: During functional activity Standing balance-Leahy Scale: Fair                      Cognition Arousal/Alertness: Awake/alert Behavior During Therapy: WFL for  tasks assessed/performed Overall Cognitive Status: Within Functional Limits for tasks assessed                      Exercises      General Comments        Pertinent Vitals/Pain Pain Assessment: 0-10 Pain Score: 5  Pain Location: abdomen Pain Descriptors / Indicators: Sore Pain Intervention(s): Monitored during session    Home Living                      Prior Function            PT Goals (current goals can now be found in the care plan section) Progress towards PT goals: Progressing toward goals    Frequency  Min 3X/week    PT Plan Current plan remains appropriate    Co-evaluation             End of Session Equipment Utilized During Treatment: Oxygen;Gait belt Activity Tolerance: Patient tolerated treatment well Patient left: in bed;with call bell/phone within reach     Time: 1342-1405 PT Time Calculation (min) (ACUTE ONLY): 23 min  Charges:  $Gait Training: 23-37 mins                    G Codes:      Rebeca Alert, MPT Pager: [phone]

## 2014-12-20 NOTE — Progress Notes (Addendum)
Patient ID: Marie Brooks, female   DOB: 03/06/1961, 54 y.o.   MRN: [id number] [facility] PROGRESS NOTE  Marie Brooks FKC:[id number] DOB: 03/24/1961 DOA: 12/14/2014 PCP: Andrena Mews, NP  Brief narrative:    54 y.o. female with a past medical history of alcohol liver cirrhosis, hypertension, COPD, chronic systolic and diastolic CHF (based on cath 10/31/2013), ongoing alcohol abuse. Patient presented to [facility] 12/14/2014 with worsening shortness of breath and abdominal distention. She had paracentesis in ED with 4 L fluid removed. She became hypotensive. In addition her hospital course was complicated with hemoglobin of 5.5 and temperature of 10 19F. She was on abx for E.Coli UTI.  Anticipated discharge: 12/21/2014 with home health PT.   Assessment/Plan:    Principal Problem:  Sepsis secondary to Escherichia coli UTI with septic versus hemorrhagic shock / leukocytosis - Sepsis criteria met with lactic acidosis fever, tachycardia, tachypnea and hypotension with source of infection E.Coli UTI. CXR showed no acute cardiopulmonary process. - Patient did not require pressor support. Her blood pressure normalized with IV fluids. - Blood cultures so far shows no growth. Ascitic fluid and stool culture as well as C. difficile PCR all negative. - She has received abx for total of 7 days for Escherichia coli UTI. - Sepsis resolved.  Active Problems:  Dyspnea / anasarca - Shortness of breath likely due to fluid overload - Lasix was given 12/16/2014. She is currently on Lasix 40 mg twice daily. - Even though she reports shortness of breath her respiratory status is stable at this time.   Hernia of abdominal cavity - Not incarcerated. Evaluated by general surgery.  - No current indication for repair.   Chronic pancreatitis - Calcifications of pancreas noted on CT of the abdomen. - Continue pancreatic lipases supplementation   Acute blood loss anemia / symptomatic anemia  secondary to GI bleeding / anemia of chronic disease/  - Anemia likely secondary to combination of anemia of chronic disease in the setting of bone marrow suppression from liver cirrhosis in addition to GI bleed likely from varices. - Hemoglobin was 5.5 on the admission. Patient has received 1 unit of PRBC transfusion during this hospital stay. - Hemoglobin remains stable  - Patient has received octreotide during this hospital stay.    Alcoholic liver cirrhosis / Ascites / Coagulopathy - Has had paracentesis on admission and became hypotensive. We'll hold off on paracentesis for now. - Continue Lasix 40 mg twice daily - Continue Protonix 40 mg daily - Continue rifaximin 550 mg twice daily - Continue spironolactone 100 mg twice daily - Continue folic acid, multivitamin, thiamine    Alcohol abuse - Counseled on stopping alcohol use. - No reports of withdrawals - As mentioned, continue folic acid, multivitamin and thiamine    Essential hypertension - Currently on Lasix and spironolactone - Please note, no evidence of congestive heart failure on 2-D echo in July 2015   COPD / acute on chronic hypoxic respiratory failure - Stable, not in acute exacerbation - Continue Dulera, bronchodilators, Symbicort and Spiriva.   Hyponatremia - Secondary to liver cirrhosis, fluid overload - Stable   Hypocalcemia - Treated with calcium gluconate 2    Thrombocytopenia - Secondary to bone marrow suppression from liver cirrhosis - Platelets stable   DVT Prophylaxis  - SCDs bilaterally   Code Status: Full.  Family Communication:  plan of care discussed with the patient Disposition Plan: Home 12/21/2014. Home with home health PT.  IV access:  Peripheral IV  Procedures and diagnostic  studies:    No results found.  Medical Consultants:  Pulmonology/critical care: Corey Harold, NP Gastroenterology  Other Consultants:  Physical therapy  IAnti-Infectives:   Cefotaxime, start date  8/25 >>>8/26 Zosyn, start date 8/25 >>>8/26 Vancomycin, start date 8/25 >>>8/26 Flagyl start date 8/25 >>>8/27 Rocephin 8/26 >>>8/30   Leisa Lenz, MD  [facility] Pager [phone]  Time spent in minutes: 25 minutes  If 7PM-7AM, please contact night-coverage [email or web address] Password [facility] 12/20/2014, 12:17 PM   LOS: 6 days    HPI/Subjective: No acute overnight events. Patient reports still feeling short of breath.    Objective: Filed Vitals:   12/19/14 2140 12/20/14 0531 12/20/14 0601 12/20/14 0811  BP: 103/54 81/43 104/64   Pulse: 98 77 82   Temp: 98.2 F (36.8 C) 98.1 F (36.7 C)    TempSrc: Oral Oral    Resp: 18 18    Height:      Weight:      SpO2: 99% 100%  99%    Intake/Output Summary (Last 24 hours) at 12/20/14 1217 Last data filed at 12/20/14 1115  Gross per 24 hour  Intake  941.5 ml  Output    950 ml  Net   -8.5 ml    Exam:   General:  Pt is alert, follows commands appropriately, not in acute distress  Cardiovascular: Regular rate and rhythm, S1/S2, no murmurs  Respiratory: Clear to auscultation bilaterally, no wheezing, no crackles, no rhonchi  Abdomen: distended, bowel sounds present  Extremities: +1 lower extremity edema, pulses DP and PT palpable bilaterally  Neuro: Grossly nonfocal  Data Reviewed: Basic Metabolic Panel:  Recent Labs Lab 12/15/14 0357 12/16/14 0353 12/17/14 0421 12/19/14 0525 12/20/14 0750  NA 133* 130* 136 135 136  K 3.5 3.9 4.4 3.7 4.1  CL 97* 101 105 101 99*  CO2 28 23 27 30 30   GLUCOSE 120* 239* 107* 99 107*  BUN 8 9 9 8 8   CREATININE 0.79 0.78 0.75 0.56 0.64  CALCIUM 8.1* 7.1* 7.4* 7.3* 7.7*   Liver Function Tests:  Recent Labs Lab 12/14/14 2035 12/15/14 0037 12/15/14 0357  AST 38 31 33  ALT 15 13* 12*  ALKPHOS 139* 127* 116  BILITOT 2.4* 2.9* 2.7*  PROT 6.9 6.8 6.3*  ALBUMIN 2.8* 3.1* 2.8*   No results for input(s): LIPASE, AMYLASE in the last 168 hours.  Recent Labs Lab  12/15/14 0646  AMMONIA 28   CBC:  Recent Labs Lab 12/14/14 2035 12/15/14 0037 12/15/14 0357 12/15/14 0646 12/15/14 1804 12/16/14 0353 12/17/14 0421 12/20/14 0750  WBC 12.6* 8.5 9.4  --   --  5.2 5.9 7.4  NEUTROABS 9.4* 5.7  --   --   --   --   --   --   HGB 5.9* 8.9* 8.9* 8.6* 7.1* 7.9* 9.0* 9.1*  HCT 18.4* 26.9* 25.8* 24.9* 22.6* 22.9* 26.5* 27.1*  MCV 82.5 82.0 81.1  --   --  81.5 81.8 81.6  PLT 185 149* 164  --   --  114* 136* 175   Cardiac Enzymes: No results for input(s): CKTOTAL, CKMB, CKMBINDEX, TROPONINI in the last 168 hours. BNP: Invalid input(s): POCBNP CBG:  Recent Labs Lab 12/18/14 0800 12/18/14 1230 12/18/14 1641 12/18/14 2101 12/19/14 0747  GLUCAP 88 174* 76 147* 73    Urine culture     Status: None   Collection Time: 12/14/14  7:52 PM  Result Value Ref Range Status   Specimen Description URINE, CLEAN CATCH  Final  Organism ID, Bacteria ESCHERICHIA COLI  Final      Susceptibility   Escherichia coli - MIC*    AMPICILLIN >=32 RESISTANT Resistant     CEFAZOLIN <=4 SENSITIVE Sensitive     CEFTRIAXONE <=1 SENSITIVE Sensitive     CIPROFLOXACIN <=0.25 SENSITIVE Sensitive     GENTAMICIN <=1 SENSITIVE Sensitive     IMIPENEM <=0.25 SENSITIVE Sensitive     NITROFURANTOIN <=16 SENSITIVE Sensitive     TRIMETH/SULFA >=320 RESISTANT Resistant     AMPICILLIN/SULBACTAM 16 INTERMEDIATE Intermediate     PIP/TAZO <=4 SENSITIVE Sensitive     * >=100,000 COLONIES/mL ESCHERICHIA COLI  Blood Culture (routine x 2)     Status: None   Collection Time: 12/14/14  8:35 PM  Result Value Ref Range Status   Specimen Description BLOOD EJ  Final   Special Requests BOTTLES DRAWN AEROBIC AND ANAEROBIC 5CC  Final   Culture   Final    NO GROWTH 5 DAYS    Report Status 12/20/2014 FINAL  Final  Blood Culture (routine x 2)     Status: None   Collection Time: 12/14/14  8:35 PM  Result Value Ref Range Status   Specimen Description BLOOD RIGHT HAND  Final   Special Requests  BOTTLES DRAWN AEROBIC ONLY 5ML  Final   Culture   Final    NO GROWTH 5 DAYS    Report Status 12/19/2014 FINAL  Final  Culture, body fluid-bottle     Status: None   Collection Time: 12/14/14  9:53 PM  Result Value Ref Range Status   Specimen Description FLUID  Final   Special Requests NONE  Final   Culture   Final    NO GROWTH 5 DAYS    Report Status 12/20/2014 FINAL  Final  Gram stain     Status: None (Preliminary result)   Collection Time: 12/14/14  9:53 PM  Result Value Ref Range Status   Specimen Description FLUID  Final   Special Requests NONE  Final   Gram Stain   Final    NO ORGANISMS SEEN    Report Status PENDING  Incomplete  MRSA PCR Screening     Status: None   Collection Time: 12/15/14 12:20 AM  Result Value Ref Range Status   MRSA by PCR NEGATIVE NEGATIVE Final  C difficile quick scan w PCR reflex     Status: None   Collection Time: 12/15/14 11:06 AM  Result Value Ref Range Status   C Diff antigen NEGATIVE NEGATIVE Final   C Diff toxin NEGATIVE NEGATIVE Final   C Diff interpretation Negative for toxigenic C. difficile  Final  Stool culture     Status: None   Collection Time: 12/15/14 11:06 AM  Result Value Ref Range Status   Specimen Description STOOL  Final   Special Requests NONE  Final   Culture   Final    NO SALMONELLA, SHIGELLA, CAMPYLOBACTER, YERSINIA, OR E.COLI 0157:[id number] ISOLATED Note: REDUCED NORMAL FLORA PRESENT   Report Status 12/19/2014 FINAL  Final     Scheduled Meds: . albuterol  2.5 mg Nebulization TID  . budesonide-formoterol  2 puff Inhalation BID  . folic acid  1 mg Oral Daily  . furosemide  40 mg Oral BID  . hydrocortisone cream  1 application Topical BID  . lipase/protease/amylase  36,000 Units Oral TID WC  . multivitamin   1 tablet Oral Daily  . pantoprazole  40 mg Oral Daily  . rifaximin  550 mg Oral BID  .  simethicone  80 mg Oral QID  . spironolactone  100 mg Oral BID  . thiamine  100 mg Oral Daily  . tiotropium  18 mcg  Inhalation Daily   Continuous Infusions: . sodium chloride 10 mL/hr at 12/17/14 1836

## 2014-12-20 NOTE — Progress Notes (Signed)
Progress Note   Subjective  Waiting on someone to assist her with ambulation. Still has some generalized abdominal discomfort   Objective   Vital signs in last 24 hours: Temp:  [98.1 F (36.7 C)-98.5 F (36.9 C)] 98.1 F (36.7 C) (08/31 0531) Pulse Rate:  [77-98] 82 (08/31 0601) Resp:  [18] 18 (08/31 0531) BP: (81-104)/(43-70) 104/64 mmHg (08/31 0601) SpO2:  [98 %-100 %] 99 % (08/31 0811) Last BM Date: 12/19/14 General:    Pleasant black female in NAD Heart:  Regular rate and rhythm Abdomen:  Soft, moderately distended, non reducible umbilical hernia, a few bowel sounds. Extremities:  Without edema. Neurologic:  Alert and oriented,  grossly normal neurologically. Psych:  Cooperative. Normal mood and affect.  Lab Results:  Recent Labs  12/20/14 0750  WBC 7.4  HGB 9.1*  HCT 27.1*  PLT 175   BMET  Recent Labs  12/19/14 0525 12/20/14 0750  NA 135 136  K 3.7 4.1  CL 101 99*  CO2 30 30  GLUCOSE 99 107*  BUN 8 8  CREATININE 0.56 0.64  CALCIUM 7.3* 7.7*      Assessment / Plan:   1. ETOH cirrhosis and chronic calcific pancreatitis. Admitted with SOB / ascites. She had a LVP a few days ago, became hypotensive after 4 liters. Still with significant ascites. Lasix and aldactone increased yesterday. She had total of 500cc output on night shift. On 2 gm sodium diet. Normal renal function.  She could probably go home soon on strict 2 gm sodium diet and monitoring daily weights. Patient known to [facility] but just met them when recently hospitalized with ascites. She mentioned name of a Physician Assistant who I was able to locate at [facility]. I made patient a follow up appt there on Tuesday Sept 20th at 9:00am. Please fax discharge summary to [place] at [phone].  2. Anemia, heme positive stools. No overt bleeding. Hgb stable at 9.1  post 2 units of blood. Will need eventual EGD for varices screening as outpatient.     LOS: 6 days    Tye Savoy  12/20/2014, 9:02 AM     Attending physician's note   I have taken an interval history, reviewed the chart and examined the patient. I agree with the Advanced Practitioner's note, impression and recommendations. Cirrhosis with significant ascites and an umbilical hernia. Continue diuretics at current dosing. Monitor daily weights, Is/Os, BMET. Would be cautious with repeating LVP given hypotension following recent LVP. GI signing off. We are available if needed. Outpatient GI follow up with [facility].   Pricilla Riffle. Fuller Plan, MD [facility] [phone] pager Mon-Fri 8a-5p [phone] weekends, holidays and 5p-8a or per [facility]

## 2014-12-20 NOTE — Care Management Important Message (Signed)
Important Message  Patient Details  Name: Marie Brooks MRN: [id number] Date of Birth: 24-Apr-1960   Medicare Important Message Given:  Yes-third notification given    Haskell Flirt 12/20/2014, 1:03 PMImportant Message  Patient Details  Name: Marie Brooks MRN: [id number] Date of Birth: 1961-02-23   Medicare Important Message Given:  Yes-third notification given    Haskell Flirt 12/20/2014, 1:03 PM

## 2014-12-20 NOTE — Progress Notes (Signed)
Clinical Social Work  CSW met with patient after she worked with PT. PT recommending [facility] at [place]. Patient reports she is feeling better and no longer wanting SNF placement. Patient wants to return home with [facility] but does need assistance with transportation. CSW will continue to follow.  [place], [place] [phone]

## 2014-12-21 DIAGNOSIS — B9620 Unspecified Escherichia coli [E. coli] as the cause of diseases classified elsewhere: Secondary | ICD-10-CM

## 2014-12-21 MED ORDER — THIAMINE HCL 100 MG PO TABS: 100.0000 mg | ORAL_TABLET | Freq: Every day | ORAL | Status: AC

## 2014-12-21 MED ORDER — ADULT MULTIVITAMIN W/MINERALS CH: 1.0000 | ORAL_TABLET | Freq: Every day | ORAL | Status: AC

## 2014-12-21 MED ORDER — FUROSEMIDE 40 MG PO TABS: 40.0000 mg | ORAL_TABLET | Freq: Two times a day (BID) | ORAL | Status: AC

## 2014-12-21 MED ORDER — PANCRELIPASE (LIP-PROT-AMYL) 36000-114000 UNITS PO CPEP: 36000.0000 [IU] | ORAL_CAPSULE | Freq: Three times a day (TID) | ORAL | Status: AC

## 2014-12-21 MED ORDER — POTASSIUM CHLORIDE CRYS ER 20 MEQ PO TBCR: 20.0000 meq | EXTENDED_RELEASE_TABLET | Freq: Every day | ORAL | Status: AC

## 2014-12-21 MED ORDER — SPIRONOLACTONE 100 MG PO TABS: 100.0000 mg | ORAL_TABLET | Freq: Two times a day (BID) | ORAL | Status: AC

## 2014-12-21 MED ORDER — FOLIC ACID 1 MG PO TABS: 1.0000 mg | ORAL_TABLET | Freq: Every day | ORAL | Status: AC

## 2014-12-21 MED ORDER — ONDANSETRON HCL 4 MG PO TABS: 4.0000 mg | ORAL_TABLET | Freq: Four times a day (QID) | ORAL | Status: AC | PRN

## 2014-12-21 MED ORDER — RIFAXIMIN 550 MG PO TABS: 550.0000 mg | ORAL_TABLET | Freq: Two times a day (BID) | ORAL | Status: AC

## 2014-12-21 MED ORDER — PANTOPRAZOLE SODIUM 40 MG PO TBEC: 40.0000 mg | DELAYED_RELEASE_TABLET | Freq: Every day | ORAL | Status: AC

## 2014-12-21 NOTE — Discharge Summary (Signed)
Physician Discharge Summary  Marie Brooks XBM:[id number] DOB: 1960-09-07 DOA: 12/14/2014  PCP: Andrena Mews, NP  Admit date: 12/14/2014 Discharge date: 12/21/2014  Recommendations for Outpatient Follow-up:  Appointment at [facility] made for 9/20 at 9 am. Continue Protonix, rifaximin. Continue Lasix, spironolactone, potassium  Discharge Diagnoses:  Principal Problem:   Sepsis Active Problems:   HTN (hypertension)   Ascites   Alcohol abuse   Thrombocytopenia   Symptomatic anemia   SBP (spontaneous bacterial peritonitis)   Acute blood loss anemia   Hypotension   GI bleed   COPD (chronic obstructive pulmonary disease)   Coagulopathy   Hernia of abdominal cavity   Lower abdominal pain   Alcoholic cirrhosis of liver with ascites   Heme positive stool   Anemia, chronic disease   Chronic pancreatitis   E-coli UTI   Hyperglycemia   Umbilical hernia   Sepsis secondary to UTI    Discharge Condition: stable   Diet recommendation: as tolerated   History of present illness:  54 y.o. female with a past medical history of alcohol liver cirrhosis, hypertension, COPD, chronic systolic and diastolic CHF (based on cath 10/31/2013), ongoing alcohol abuse. Patient presented to [facility] 12/14/2014 with worsening shortness of breath and abdominal distention. She had paracentesis in ED with 4 L fluid removed. She became hypotensive. In addition her hospital course was complicated with hemoglobin of 5.5 and temperature of 10 2F. She was on abx for E.Coli UTI.  Hospital Course:   Assessment/Plan:    Principal Problem:  Sepsis secondary to Escherichia coli UTI with septic versus hemorrhagic shock / leukocytosis - Sepsis criteria met with lactic acidosis fever, tachycardia, tachypnea and hypotension with source of infection E.Coli UTI. CXR showed no acute cardiopulmonary process. - Urine culture grew Escherichia coli. Patient received anti-biotic for 7 days. She does  not need antibiotics on discharge. - Blood cultures so far show no growth. Ascitic fluid culture shows no growth. Stool culture and C. difficile negative.  Active Problems:  Dyspnea / anasarca - Shortness of breath likely due to fluid overload - Lasix was given 12/16/2014.  - Continue Lasix 40 mg twice daily on discharge    Hernia of abdominal cavity - Not incarcerated. Evaluated by general surgery.  - No current indication for repair.   Chronic pancreatitis - Calcifications of pancreas noted on CT of the abdomen. 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 She has received octreotide infusion during this hospitalization.  - Hemoglobin was 5.5 on the admission. Patient has received 1 unit of PRBC transfusion during this hospital stay. - Hemoglobin stable    Alcoholic liver cirrhosis / Ascites / Coagulopathy - Has had paracentesis on admission and became hypotensive.  - Continue Lasix 40 mg twice daily - Continue Protonix 40 mg daily - Continue rifaximin 550 mg twice daily - Continue spironolactone 100 mg twice daily - Continue folic acid, multivitamin, thiamine   Alcohol abuse - Counseled on stopping alcohol use. - ontinue folic acid, multivitamin and thiamine   Essential hypertension - Currently on Lasix and spironolactone - Please note, no evidence of congestive heart failure on 2-D echo in July 2015; does not need coreg on discharge.    COPD / acute on chronic hypoxic  respiratory failure - Continue Dulera, bronchodilators, Symbicort and Spiriva.   Hyponatremia - Secondary to liver cirrhosis, fluid overload - Stable   Hypocalcemia - Treated with calcium gluconate during this  hospital stay    Thrombocytopenia - Secondary to bone marrow suppression from liver cirrhosis - Platelets stable   DVT Prophylaxis  - SCDs bilaterally in hospital    Code Status: Full.  Family Communication: plan of care discussed with the patient   IV access:  Peripheral IV  Procedures and diagnostic studies:   No results found.  Medical Consultants:  Pulmonology/critical care: Corey Harold, NP Gastroenterology  Other Consultants:  Physical therapy  IAnti-Infectives:   Cefotaxime, start date 8/25 >>>8/26 Zosyn, start date 8/25 >>>8/26 Vancomycin, start date 8/25 >>>8/26 Flagyl start date 8/25 >>>8/27 Rocephin 8/26 >>>8/30    Signed:  Leisa Lenz, MD  [facility] 12/21/2014, 10:45 AM  Pager #: [phone]  Time spent in minutes: more than 30 minutes  Discharge Exam: Filed Vitals:   12/21/14 0608  BP: 90/44  Pulse: 73  Temp:   Resp:    Filed Vitals:   12/20/14 2110 12/21/14 0605 12/21/14 0608 12/21/14 0809  BP: 95/56 77/46 90/44    Pulse: 98 76 73   Temp: 98.2 F (36.8 C) 98 F (36.7 C)    TempSrc: Oral Oral    Resp: 18 18    Height:      Weight:   71.8 kg (158 lb 4.6 oz)   SpO2: 100% 100%  98%    General: Pt is alert, follows commands appropriately, not in acute distress Cardiovascular: Regular rate and rhythm, S1/S2 +, no murmurs Respiratory: Clear to auscultation bilaterally, no wheezing, no crackles, no rhonchi Abdominal: distended, bowel sounds +, no guarding Extremities: no cyanosis, pulses palpable bilaterally DP and PT Neuro: Grossly nonfocal  Discharge Instructions  Discharge Instructions    Call MD for:  difficulty breathing, headache or visual disturbances    Complete by:  As directed      Call MD for:  persistant dizziness or light-headedness    Complete by:  As directed      Call MD for:  redness, tenderness, or signs of infection (pain, swelling, redness, odor or green/yellow discharge around  incision site)    Complete by:  As directed      Call MD for:  severe uncontrolled pain    Complete by:  As directed      Diet - low sodium heart healthy    Complete by:  As directed      Discharge instructions    Complete by:  As directed   Appointment at [facility] made for 9/20 at 9 am. Continue Protonix, rifaximin. Continue Lasix, spironolactone, potassium     Increase activity slowly    Complete by:  As directed             Medication List    STOP taking these medications        carvedilol 3.125 MG tablet  Commonly known as:  COREG     guaiFENesin 600 MG 12 hr tablet  Commonly known as:  MUCINEX     lactulose 10 GM/15ML solution  Commonly known as:  CHRONULAC     levofloxacin 750 MG tablet  Commonly known as:  LEVAQUIN     lisinopril 10 MG tablet  Commonly known as:  PRINIVIL,ZESTRIL     predniSONE 20 MG tablet  Commonly known as:  DELTASONE      TAKE these medications  albuterol 108 (90 BASE) MCG/ACT inhaler  Commonly known as:  PROVENTIL HFA;VENTOLIN HFA  Inhale 2 puffs into the lungs every 6 (six) hours as needed for wheezing or shortness of breath.     albuterol (2.5 MG/3ML) 0.083% nebulizer solution  Commonly known as:  PROVENTIL  Inhale 2.5 mg into the lungs every 4 (four) hours as needed.     Fluticasone-Salmeterol 250-50 MCG/DOSE Aepb  Commonly known as:  ADVAIR  Inhale 1 puff into the lungs 2 (two) times daily.     folic acid 1 MG tablet  Commonly known as:  FOLVITE  Take 1 tablet (1 mg total) by mouth daily.     furosemide 40 MG tablet  Commonly known as:  LASIX  Take 1 tablet (40 mg total) by mouth 2 (two) times daily.     hydrocortisone 2.5 % cream  Apply 1 application topically 2 (two) times daily.     lipase/protease/amylase 36000 UNITS Cpep capsule  Commonly known as:  CREON  Take 1 capsule (36,000 Units total) by mouth 3 (three) times daily with meals.     multivitamin with minerals Tabs tablet  Take 1 tablet by mouth  daily.     nicotine 21 mg/24hr patch  Commonly known as:  NICODERM CQ - dosed in mg/24 hours  Place 1 patch (21 mg total) onto the skin daily.     ondansetron 4 MG tablet  Commonly known as:  ZOFRAN  Take 1 tablet (4 mg total) by mouth every 6 (six) hours as needed for nausea.     pantoprazole 40 MG tablet  Commonly known as:  PROTONIX  Take 1 tablet (40 mg total) by mouth daily.     potassium chloride SA 20 MEQ tablet  Commonly known as:  K-DUR,KLOR-CON  Take 1 tablet (20 mEq total) by mouth daily.     rifaximin 550 MG Tabs tablet  Commonly known as:  XIFAXAN  Take 1 tablet (550 mg total) by mouth 2 (two) times daily.     spironolactone 100 MG tablet  Commonly known as:  ALDACTONE  Take 1 tablet (100 mg total) by mouth 2 (two) times daily.     SYMBICORT 160-4.5 MCG/ACT inhaler  Generic drug:  budesonide-formoterol  Inhale 2 puffs into the lungs 2 (two) times daily.     thiamine 100 MG tablet  Take 1 tablet (100 mg total) by mouth daily.     tiotropium 18 MCG inhalation capsule  Commonly known as:  SPIRIVA  Place 18 mcg into inhaler and inhale daily.           Follow-up Information    Follow up with [facility] On 01/09/2015.   Why:  at 9 am       The results of significant diagnostics from this hospitalization (including imaging, microbiology, ancillary and laboratory) are listed below for reference.    Significant Diagnostic Studies: Ct Abdomen Pelvis Wo Contrast  12/15/2014   CLINICAL DATA:  Umbilical hernia.  EXAM: CT ABDOMEN AND PELVIS WITHOUT CONTRAST  TECHNIQUE: Multidetector CT imaging of the abdomen and pelvis was performed following the standard protocol without IV contrast.  COMPARISON:  CT scan of October 24, 2013.  FINDINGS: Moderate degenerative disc disease is noted at L5-S1. Visualized lung bases appear normal.  Small solitary gallstone is noted. No focal abnormality seen in the liver or spleen on these unenhanced images. Pancreatic calcifications are  noted consistent with chronic pancreatitis. Adrenal glands and kidneys appear normal. No hydronephrosis or renal  obstruction is noted. Moderate ascites is noted. No colonic dilatation is noted. The appendix appears normal. Mildly dilated small bowel loops are noted centrally within the abdomen most consistent with ileus. No definite transition zone is noted. Urinary bladder appears normal. Uterus and ovaries are unremarkable. Small fat containing periumbilical hernia is noted. There is no evidence of bowel incarceration. Mild anasarca is noted. No significant adenopathy is noted. Atherosclerosis of abdominal aorta is noted without aneurysm formation.  IMPRESSION: Moderate ascites is noted.  Atherosclerosis of abdominal aorta without aneurysm formation.  Pancreatic calcifications consistent with chronic pancreatitis.  Small solitary gallstone.  Mildly dilated small bowel loops are noted centrally in the abdomen most consistent with ileus. Continued radiographic follow-up is recommended.  Mild anasarca is noted.   Electronically Signed   By: Marijo Conception, M.D.   On: 12/15/2014 12:41   Dg Chest 2 View  12/14/2014   CLINICAL DATA:  Fever.  EXAM: CHEST  2 VIEW  COMPARISON:  10/28/2014  FINDINGS: The heart size and mediastinal contours are within normal limits. Both lungs are clear. The visualized skeletal structures are unremarkable.  IMPRESSION: No active cardiopulmonary disease.   Electronically Signed   By: Kerby Moors M.D.   On: 12/14/2014 20:08   Dg Chest Port 1 View  12/15/2014   CLINICAL DATA:  Hypoxia  EXAM: PORTABLE CHEST - 1 VIEW  COMPARISON:  Chest x-rays dated 12/14/2014 and 09/06/2014.  FINDINGS: Cardiomediastinal silhouette remains normal in size and configuration. Suspect minimal subsegmental atelectasis at each lung base. There is mild interstitial prominence bilaterally which is without significant change compared to multiple prior studies. Lungs otherwise clear. No confluent airspace opacity  to suggest a developing pneumonia. No pleural effusion seen. No pneumothorax. Lung volumes are normal. No osseous abnormality seen.  IMPRESSION: Probable mild subsegmental atelectasis at each lung base.  Mild interstitial prominence bilaterally which I suspect is chronic but could indicate a mild interstitial edema.  Otherwise unremarkable chest x-ray.  No evidence of pneumonia.   Electronically Signed   By: Franki Cabot M.D.   On: 12/15/2014 07:07    Microbiology: Recent Results (from the past 240 hour(s))  Urine culture     Status: None   Collection Time: 12/14/14  7:52 PM  Result Value Ref Range Status   Specimen Description URINE, CLEAN CATCH  Final   Special Requests NONE  Final   Culture   Final    >=100,000 COLONIES/mL ESCHERICHIA COLI Performed at [facility]    Report Status 12/16/2014 FINAL  Final   Organism ID, Bacteria ESCHERICHIA COLI  Final      Susceptibility   Escherichia coli - MIC*    AMPICILLIN >=32 RESISTANT Resistant     CEFAZOLIN <=4 SENSITIVE Sensitive     CEFTRIAXONE <=1 SENSITIVE Sensitive     CIPROFLOXACIN <=0.25 SENSITIVE Sensitive     GENTAMICIN <=1 SENSITIVE Sensitive     IMIPENEM <=0.25 SENSITIVE Sensitive     NITROFURANTOIN <=16 SENSITIVE Sensitive     TRIMETH/SULFA >=320 RESISTANT Resistant     AMPICILLIN/SULBACTAM 16 INTERMEDIATE Intermediate     PIP/TAZO <=4 SENSITIVE Sensitive     * >=100,000 COLONIES/mL ESCHERICHIA COLI  Blood Culture (routine x 2)     Status: None   Collection Time: 12/14/14  8:35 PM  Result Value Ref Range Status   Specimen Description BLOOD EJ  Final   Special Requests BOTTLES DRAWN AEROBIC AND ANAEROBIC 5CC  Final   Culture   Final  NO GROWTH 5 DAYS Performed at [facility]    Report Status 12/20/2014 FINAL  Final  Blood Culture (routine x 2)     Status: None   Collection Time: 12/14/14  8:35 PM  Result Value Ref Range Status   Specimen Description BLOOD RIGHT HAND  Final   Special Requests BOTTLES  DRAWN AEROBIC ONLY 5ML  Final   Culture   Final    NO GROWTH 5 DAYS Performed at [facility]    Report Status 12/19/2014 FINAL  Final  Culture, body fluid-bottle     Status: None   Collection Time: 12/14/14  9:53 PM  Result Value Ref Range Status   Specimen Description FLUID  Final   Special Requests NONE  Final   Culture   Final    NO GROWTH 5 DAYS Performed at [facility]    Report Status 12/20/2014 FINAL  Final  Gram stain     Status: None (Preliminary result)   Collection Time: 12/14/14  9:53 PM  Result Value Ref Range Status   Specimen Description FLUID  Final   Special Requests NONE  Final   Gram Stain   Final    RARE WBC PRESENT,BOTH PMN AND MONONUCLEAR NO ORGANISMS SEEN Performed at [facility]    Report Status PENDING  Incomplete  MRSA PCR Screening     Status: None   Collection Time: 12/15/14 12:20 AM  Result Value Ref Range Status   MRSA by PCR NEGATIVE NEGATIVE Final    Comment:        The GeneXpert MRSA Assay (FDA approved for NASAL specimens only), is one component of a comprehensive MRSA colonization surveillance program. It is not intended to diagnose MRSA infection nor to guide or monitor treatment for MRSA infections.   C difficile quick scan w PCR reflex     Status: None   Collection Time: 12/15/14 11:06 AM  Result Value Ref Range Status   C Diff antigen NEGATIVE NEGATIVE Final   C Diff toxin NEGATIVE NEGATIVE Final   C Diff interpretation Negative for toxigenic C. difficile  Final  Stool culture     Status: None   Collection Time: 12/15/14 11:06 AM  Result Value Ref Range Status   Specimen Description STOOL  Final   Special Requests NONE  Final   Culture   Final    NO SALMONELLA, SHIGELLA, CAMPYLOBACTER, YERSINIA, OR E.COLI 0157:[id number] ISOLATED Note: REDUCED NORMAL FLORA PRESENT Performed at [facility]    Report Status 12/19/2014 FINAL  Final     Labs: Basic Metabolic Panel:  Recent Labs Lab  12/15/14 0357 12/16/14 0353 12/17/14 0421 12/19/14 0525 12/20/14 0750  NA 133* 130* 136 135 136  K 3.5 3.9 4.4 3.7 4.1  CL 97* 101 105 101 99*  CO2 28 23 27 30 30   GLUCOSE 120* 239* 107* 99 107*  BUN 8 9 9 8 8   CREATININE 0.79 0.78 0.75 0.56 0.64  CALCIUM 8.1* 7.1* 7.4* 7.3* 7.7*   Liver Function Tests:  Recent Labs Lab 12/14/14 2035 12/15/14 0037 12/15/14 0357  AST 38 31 33  ALT 15 13* 12*  ALKPHOS 139* 127* 116  BILITOT 2.4* 2.9* 2.7*  PROT 6.9 6.8 6.3*  ALBUMIN 2.8* 3.1* 2.8*   No results for input(s): LIPASE, AMYLASE in the last 168 hours.  Recent Labs Lab 12/15/14 0646  AMMONIA 28   CBC:  Recent Labs Lab 12/14/14 2035 12/15/14 0037 12/15/14 0357 12/15/14 0646 12/15/14 1804 12/16/14  7158 12/17/14 0421 12/20/14 0750  WBC 12.6* 8.5 9.4  --   --  5.2 5.9 7.4  NEUTROABS 9.4* 5.7  --   --   --   --   --   --   HGB 5.9* 8.9* 8.9* 8.6* 7.1* 7.9* 9.0* 9.1*  HCT 18.4* 26.9* 25.8* 24.9* 22.6* 22.9* 26.5* 27.1*  MCV 82.5 82.0 81.1  --   --  81.5 81.8 81.6  PLT 185 149* 164  --   --  114* 136* 175   Cardiac Enzymes: No results for input(s): CKTOTAL, CKMB, CKMBINDEX, TROPONINI in the last 168 hours. BNP: BNP (last 3 results) No results for input(s): BNP in the last 8760 hours.  ProBNP (last 3 results) No results for input(s): PROBNP in the last 8760 hours.  CBG:  Recent Labs Lab 12/18/14 0800 12/18/14 1230 12/18/14 1641 12/18/14 2101 12/19/14 0747  GLUCAP 88 174* 76 147* 73

## 2014-12-21 NOTE — Discharge Instructions (Signed)
Ascites °Ascites is a gathering of fluid in the belly (abdomen). This is most often caused by liver disease. It may also be caused by a number of other less common problems. It causes a ballooning out (distension) of the abdomen. °CAUSES  °Scarring of the liver (cirrhosis) is the most common cause of ascites. Other causes include: °· Infection or inflammation in the abdomen. °· Cancer in the abdomen. °· Heart failure. °· Certain forms of kidney failure (nephritic syndrome). °· Inflammation of the pancreas. °· Clots in the veins of the liver. °SYMPTOMS  °In the early stages of ascites, you may not have any symptoms. The main symptom of ascites is a sense of abdominal bloating. This is due to the presence of fluid. This may also cause an increase in abdominal or waist size. People with this condition can develop swelling in the legs, and men can develop a swollen scrotum. When there is a lot of fluid, it may be hard to breath. Stretching of the abdomen by fluid can be painful. °DIAGNOSIS  °Certain features of your medical history, such as a history of liver disease and of an enlarging abdomen, can suggest the presence of ascites. The diagnosis of ascites can be made on physical exam by your caregiver. An abdominal ultrasound examination can confirm that ascites is present, and estimate the amount of fluid. °Once ascites is confirmed, it is important to determine its cause. Again, a history of one of the conditions listed in "CAUSES" provides a strong clue. A physical exam is important, and blood and X-ray tests may be needed. During a procedure called paracentesis, a sample of fluid is removed from the abdomen. This can determine certain key features about the fluid, such as whether or not infection or cancer is present. Your caregiver will determine if a paracentesis is necessary. They will describe the procedure to you. °PREVENTION  °Ascites is a complication of other conditions. Therefore to prevent ascites, you  must seek treatment for any significant health conditions you have. Once ascites is present, careful attention to fluid and salt intake may help prevent it from getting worse. If you have ascites, you should not drink alcohol. °PROGNOSIS  °The prognosis of ascites depends on the underlying disease. If the disease is reversible, such as with certain infections or with heart failure, then ascites may improve or disappear. When ascites is caused by cirrhosis, then it indicates that the liver disease has worsened, and further evaluation and treatment of the liver disease is needed. If your ascites is caused by cancer, then the success or failure of the cancer treatment will determine whether your ascites will improve or worsen. °RISKS AND COMPLICATIONS  °Ascites is likely to worsen if it is not properly diagnosed and treated. A large amount of ascites can cause pain and difficulty breathing. The main complication, besides worsening, is infection (called spontaneous bacterial peritonitis). This requires prompt treatment. °TREATMENT  °The treatment of ascites depends on its cause. When liver disease is your cause, medical management using water pills (diuretics) and decreasing salt intake is often effective. Ascites due to peritoneal inflammation or malignancy (cancer) alone does not respond to salt restriction and diuretics. Hospitalization is sometimes required. °If the treatment of ascites cannot be managed with medications, a number of other treatments are available. Your caregivers will help you decide which will work best for you. Some of these are: °· Removal of fluid from the abdomen (paracentesis). °· Fluid from the abdomen is passed into a vein (peritoneovenous shunting). °·   Liver transplantation.  Transjugular intrahepatic portosystemic stent shunt. HOME CARE INSTRUCTIONS  It is important to monitor body weight and the intake and output of fluids. Weigh yourself at the same time every day. Record your  weights. Fluid restriction may be necessary. It is also important to know your salt intake. The more salt you take in, the more fluid you will retain. Ninety percent of people with ascites respond to this approach.  Follow any directions for medicines carefully.  Follow up with your caregiver, as directed.  Report any changes in your health, especially any new or worsening symptoms.  If your ascites is from liver disease, avoid alcohol and other substances toxic to the liver. SEEK MEDICAL CARE IF:   Your weight increases more than a few pounds in a few days.  Your abdominal or waist size increases.  You develop swelling in your legs.  You had swelling and it worsens. SEEK IMMEDIATE MEDICAL CARE IF:   You develop a fever.  You develop new abdominal pain.  You develop difficulty breathing.  You develop confusion.  You have bleeding from the mouth, stomach, or rectum. MAKE SURE YOU:   Understand these instructions.  Will watch your condition.  Will get help right away if you are not doing well or get worse. Document Released: 04/07/2005 Document Revised: 06/30/2011 Document Reviewed: 11/06/2006 [facility] Patient Information 2015 [place], [place]. This information is not intended to replace advice given to you by your health care provider. Make sure you discuss any questions you have with your health care provider. Pantoprazole tablets What is this medicine? PANTOPRAZOLE (pan TOE pra zole) prevents the production of acid in the stomach. It is used to treat gastroesophageal reflux disease (GERD), inflammation of the esophagus, and Zollinger-Ellison syndrome. This medicine may be used for other purposes; ask your health care provider or pharmacist if you have questions. COMMON BRAND NAME(S): Protonix What should I tell my health care provider before I take this medicine? They need to know if you have any of these conditions: -liver disease -low levels of magnesium in the  blood -an unusual or allergic reaction to omeprazole, lansoprazole, pantoprazole, rabeprazole, other medicines, foods, dyes, or preservatives -pregnant or trying to get pregnant -breast-feeding How should I use this medicine? Take this medicine by mouth. Swallow the tablets whole with a drink of water. Follow the directions on the prescription label. Do not crush, break, or chew. Take your medicine at regular intervals. Do not take your medicine more often than directed. Talk to your pediatrician regarding the use of this medicine in children. While this drug may be prescribed for children as young as 5 years for selected conditions, precautions do apply. Overdosage: If you think you have taken too much of this medicine contact a poison control center or emergency room at once. NOTE: This medicine is only for you. Do not share this medicine with others. What if I miss a dose? If you miss a dose, take it as soon as you can. If it is almost time for your next dose, take only that dose. Do not take double or extra doses. What may interact with this medicine? Do not take this medicine with any of the following medications: -atazanavir -nelfinavir This medicine may also interact with the following medications: -ampicillin -delavirdine -digoxin -diuretics -iron salts -medicines for fungal infections like ketoconazole, itraconazole and voriconazole -warfarin This list may not describe all possible interactions. Give your health care provider a list of all the medicines, herbs, non-prescription drugs,  or dietary supplements you use. Also tell them if you smoke, drink alcohol, or use illegal drugs. Some items may interact with your medicine. What should I watch for while using this medicine? It can take several days before your stomach pain gets better. Check with your doctor or health care professional if your condition does not start to get better, or if it gets worse. You may need blood work done  while you are taking this medicine. What side effects may I notice from receiving this medicine? Side effects that you should report to your doctor or health care professional as soon as possible: -allergic reactions like skin rash, itching or hives, swelling of the face, lips, or tongue -bone, muscle or joint pain -breathing problems -chest pain or chest tightness -dark yellow or brown urine -dizziness -fast, irregular heartbeat -feeling faint or lightheaded -fever or sore throat -muscle spasm -palpitations -redness, blistering, peeling or loosening of the skin, including inside the mouth -seizures -tremors -unusual bleeding or bruising -unusually weak or tired -yellowing of the eyes or skin Side effects that usually do not require medical attention (Report these to your doctor or health care professional if they continue or are bothersome.): -constipation -diarrhea -dry mouth -headache -nausea This list may not describe all possible side effects. Call your doctor for medical advice about side effects. You may report side effects to FDA at 1-800-FDA-1088. Where should I keep my medicine? Keep out of the reach of children. Store at room temperature between 15 and 30 degrees C (59 and 86 degrees F). Protect from light and moisture. Throw away any unused medicine after the expiration date. NOTE: This sheet is a summary. It may not cover all possible information. If you have questions about this medicine, talk to your doctor, pharmacist, or health care provider.  2015, Elsevier/Gold Standard. (2012-02-04 16:40:16) Spironolactone tablets What is this medicine? SPIRONOLACTONE (speer on oh LAK tone) is a diuretic. It helps you make more urine and to lose excess water from your body. This medicine is used to treat high blood pressure, and edema or swelling from heart, kidney, or liver disease. It is also used to treat patients who make too much aldosterone or have low potassium. This  medicine may be used for other purposes; ask your health care provider or pharmacist if you have questions. COMMON BRAND NAME(S): Aldactone What should I tell my health care provider before I take this medicine? They need to know if you have any of these conditions: -high blood level of potassium -kidney disease or trouble making urine -liver disease -an unusual or allergic reaction to spironolactone, other medicines, foods, dyes, or preservatives -pregnant or trying to get pregnant -breast-feeding How should I use this medicine? Take this medicine by mouth with a drink of water. Follow the directions on your prescription label. You can take it with or without food. If it upsets your stomach, take it with food. Do not take your medicine more often than directed. Remember that you will need to pass more urine after taking this medicine. Do not take your doses at a time of day that will cause you problems. Do not take at bedtime. Talk to your pediatrician regarding the use of this medicine in children. While this drug may be prescribed for selected conditions, precautions do apply. Overdosage: If you think you have taken too much of this medicine contact a poison control center or emergency room at once. NOTE: This medicine is only for you. Do not share this medicine  with others. What if I miss a dose? If you miss a dose, take it as soon as you can. If it is almost time for your next dose, take only that dose. Do not take double or extra doses. What may interact with this medicine? Do not take this medicine with any of the following medications: -eplerenone This medicine may also interact with the following medications: -corticosteroids -digoxin -lithium -medicines for high blood pressure like ACE inhibitors -skeletal muscle relaxants like tubocurarine -NSAIDs, medicines for pain and inflammation, like ibuprofen or naproxen -potassium products like salt substitute or supplements -pressor  amines like norepinephrine -some diuretics This list may not describe all possible interactions. Give your health care provider a list of all the medicines, herbs, non-prescription drugs, or dietary supplements you use. Also tell them if you smoke, drink alcohol, or use illegal drugs. Some items may interact with your medicine. What should I watch for while using this medicine? Visit your doctor or health care professional for regular checks on your progress. Check your blood pressure as directed. Ask your doctor what your blood pressure should be, and when you should contact them. You may need to be on a special diet while taking this medicine. Ask your doctor. Also, ask how many glasses of fluid you need to drink a day. You must not get dehydrated. This medicine may make you feel confused, dizzy or lightheaded. Drinking alcohol and taking some medicines can make this worse. Do not drive, use machinery, or do anything that needs mental alertness until you know how this medicine affects you. Do not sit or stand up quickly. What side effects may I notice from receiving this medicine? Side effects that you should report to your doctor or health care professional as soon as possible: -allergic reactions such as skin rash or itching, hives, swelling of the lips, mouth, tongue, or throat -black or tarry stools -fast, irregular heartbeat -fever -muscle pain, cramps -numbness, tingling in hands or feet -trouble breathing -trouble passing urine -unusual bleeding -unusually weak or tired Side effects that usually do not require medical attention (report to your doctor or health care professional if they continue or are bothersome): -change in voice or hair growth -confusion -dizzy, drowsy -dry mouth, increased thirst -enlarged or tender breasts -headache -irregular menstrual periods -sexual difficulty, unable to have an erection -stomach upset This list may not describe all possible side effects.  Call your doctor for medical advice about side effects. You may report side effects to FDA at 1-800-FDA-1088. Where should I keep my medicine? Keep out of the reach of children. Store below 25 degrees C (77 degrees F). Throw away any unused medicine after the expiration date. NOTE: This sheet is a summary. It may not cover all possible information. If you have questions about this medicine, talk to your doctor, pharmacist, or health care provider.  2015, Elsevier/Gold Standard. (2009-12-18 12:51:30) Rifaximin tablets What is this medicine? RIFAXIMIN (ri FAX i men) is an antibiotic. It is used to treat traveler's diarrhea. It is also used to prevent hepatic encephalopathy, a brain disorder that results from liver disease. This medicine may be used for other purposes; ask your health care provider or pharmacist if you have questions. COMMON BRAND NAME(S): Xifaxan What should I tell my health care provider before I take this medicine? They need to know if you have any of these conditions: -bloody or tarry stools -fever -liver disease -an unusual or allergic reaction to rifaximin, rifampin, rifabutin, other medicines, foods, dyes, or  preservatives -pregnant or trying to get pregnant -breast-feeding How should I use this medicine? Take this medicine by mouth with a glass of water. Follow the directions on the prescription label. This medicine may be taken with or without food. Take your medicine at regular intervals. Do not take your medicine more often than directed. Take all of your medicine as directed even if you think your are better. Do not skip doses or stop your medicine early. Talk to your pediatrician regarding the use of this medicine in children. Special care may be needed. Overdosage: If you think you have taken too much of this medicine contact a poison control center or emergency room at once. NOTE: This medicine is only for you. Do not share this medicine with others. What if I  miss a dose? If you miss a dose, take it as soon as you can. If it is almost time for your next dose, take only that dose. Do not take double or extra doses. What may interact with this medicine? -birth control pills -cyclosporine -midazolam -verapamil This list may not describe all possible interactions. Give your health care provider a list of all the medicines, herbs, non-prescription drugs, or dietary supplements you use. Also tell them if you smoke, drink alcohol, or use illegal drugs. Some items may interact with your medicine. What should I watch for while using this medicine? Tell your doctor or healthcare professional if your symptoms do not start to get better or if they get worse. Do not treat diarrhea with over the counter products. Contact your doctor if you have diarrhea that lasts more than 2 days or if it is severe and watery. What side effects may I notice from receiving this medicine? Side effects that you should report to your doctor or health care professional as soon as possible: -allergic reactions like skin rash, itching or hives, swelling of the face, lips, or tongue -blood in the urine -bloody or watery diarrhea -fever Side effects that usually do not require medical attention (report to your doctor or health care professional if they continue or are bothersome): -constipation -headache -nausea, vomiting -stomach bloating, gas -urgent bowel movements This list may not describe all possible side effects. Call your doctor for medical advice about side effects. You may report side effects to FDA at 1-800-FDA-1088. Where should I keep my medicine? Keep out of the reach of children. Store at room temperature between 15 and 30 degrees C (59 and 86 degrees F). Throw away any unused medicine after the expiration date. NOTE: This sheet is a summary. It may not cover all possible information. If you have questions about this medicine, talk to your doctor, pharmacist, or  health care provider.  2015, Elsevier/Gold Standard. (2012-07-06 11:08:42)

## 2014-12-21 NOTE — Progress Notes (Signed)
Rolling wlaker ordered for patient through Dr. Janeth Rase home care made aware of discharge for continuation of [facility]-rn,ot,pt and aide.

## 2014-12-21 NOTE — Progress Notes (Signed)
PT DC home. She was given prescriptions, education, and instructions. Says she has an understanding. She was transferred out via [facility] for taxi transport home.

## 2014-12-26 LAB — GRAM STAIN

## 2014-12-27 ENCOUNTER — Telehealth: Payer: Self-pay | Admitting: Gastroenterology

## 2014-12-27 NOTE — Telephone Encounter (Signed)
Called patient to inform her we did not prescribe any medications for her in the hospital. Patient states "she has got it straightened out". She states she had [place] mixed up with [facility].

## 2015-11-03 ENCOUNTER — Other Ambulatory Visit (HOSPITAL_COMMUNITY): Payer: Medicare Other

## 2015-11-03 ENCOUNTER — Inpatient Hospital Stay
Admission: RE | Admit: 2015-11-03 | Discharge: 2015-11-20 | Disposition: E | Payer: Medicare Other | Source: Ambulatory Visit | Attending: Internal Medicine | Admitting: Internal Medicine

## 2015-11-03 DIAGNOSIS — IMO0002 Reserved for concepts with insufficient information to code with codable children: Secondary | ICD-10-CM

## 2015-11-03 DIAGNOSIS — Z931 Gastrostomy status: Secondary | ICD-10-CM

## 2015-11-03 DIAGNOSIS — R188 Other ascites: Secondary | ICD-10-CM

## 2015-11-03 DIAGNOSIS — J9690 Respiratory failure, unspecified, unspecified whether with hypoxia or hypercapnia: Secondary | ICD-10-CM

## 2015-11-03 LAB — PROCALCITONIN: Procalcitonin: 0.18 ng/mL

## 2015-11-04 LAB — COMPREHENSIVE METABOLIC PANEL
ALBUMIN: 2 g/dL — AB (ref 3.5–5.0)
ALK PHOS: 95 U/L (ref 38–126)
ALT: 10 U/L — ABNORMAL LOW (ref 14–54)
ANION GAP: 5 (ref 5–15)
AST: 26 U/L (ref 15–41)
BILIRUBIN TOTAL: 1 mg/dL (ref 0.3–1.2)
BUN: 25 mg/dL — ABNORMAL HIGH (ref 6–20)
CALCIUM: 8.1 mg/dL — AB (ref 8.9–10.3)
CO2: 24 mmol/L (ref 22–32)
Chloride: 103 mmol/L (ref 101–111)
Creatinine, Ser: 0.66 mg/dL (ref 0.44–1.00)
GFR calc Af Amer: >60 mL/min (ref >60)
GLUCOSE: 157 mg/dL — AB (ref 65–99)
POTASSIUM: 4.7 mmol/L (ref 3.5–5.1)
Sodium: 132 mmol/L — ABNORMAL LOW (ref 135–145)
TOTAL PROTEIN: 5.8 g/dL — AB (ref 6.5–8.1)

## 2015-11-04 LAB — CBC WITH DIFFERENTIAL/PLATELET
BASOS ABS: 0 10*3/uL (ref 0.0–0.1)
Basophils Relative: 0 %
EOS PCT: 4 %
Eosinophils Absolute: 0.5 10*3/uL (ref 0.0–0.7)
HEMATOCRIT: 21 % — AB (ref 36.0–46.0)
Hemoglobin: 7 g/dL — ABNORMAL LOW (ref 12.0–15.0)
LYMPHS ABS: 1.6 10*3/uL (ref 0.7–4.0)
Lymphocytes Relative: 12 %
MCH: 26 pg (ref 26.0–34.0)
MCHC: 33.3 g/dL (ref 30.0–36.0)
MCV: 78.1 fL (ref 78.0–100.0)
MONO ABS: 1.9 10*3/uL — AB (ref 0.1–1.0)
MONOS PCT: 14 %
NEUTROS ABS: 9.4 10*3/uL — AB (ref 1.7–7.7)
Neutrophils Relative %: 70 %
PLATELETS: 177 10*3/uL (ref 150–400)
RBC: 2.69 MIL/uL — ABNORMAL LOW (ref 3.87–5.11)
RDW: 21.7 % — AB (ref 11.5–15.5)
WBC: 13.4 10*3/uL — ABNORMAL HIGH (ref 4.0–10.5)

## 2015-11-04 LAB — MAGNESIUM: MAGNESIUM: 1.5 mg/dL — AB (ref 1.7–2.4)

## 2015-11-04 LAB — PHOSPHORUS: Phosphorus: 4.1 mg/dL (ref 2.5–4.6)

## 2015-11-04 LAB — PROTIME-INR
INR: 1.64 — ABNORMAL HIGH (ref 0.00–1.49)
Prothrombin Time: 19.4 seconds — ABNORMAL HIGH (ref 11.6–15.2)

## 2015-11-04 LAB — TSH: TSH: 3.652 u[IU]/mL (ref 0.350–4.500)

## 2015-11-05 ENCOUNTER — Other Ambulatory Visit (HOSPITAL_COMMUNITY): Payer: Medicare Other

## 2015-11-05 LAB — HEMOGLOBIN A1C
HEMOGLOBIN A1C: 5.4 % (ref 4.8–5.6)
MEAN PLASMA GLUCOSE: 108 mg/dL

## 2015-11-05 LAB — PROCALCITONIN: PROCALCITONIN: 0.22 ng/mL

## 2015-11-06 LAB — CBC
HCT: 23.9 % — ABNORMAL LOW (ref 36.0–46.0)
Hemoglobin: 7.7 g/dL — ABNORMAL LOW (ref 12.0–15.0)
MCH: 25.2 pg — AB (ref 26.0–34.0)
MCHC: 32.2 g/dL (ref 30.0–36.0)
MCV: 78.1 fL (ref 78.0–100.0)
PLATELETS: 295 10*3/uL (ref 150–400)
RBC: 3.06 MIL/uL — ABNORMAL LOW (ref 3.87–5.11)
RDW: 22.1 % — AB (ref 11.5–15.5)
WBC: 11 10*3/uL — ABNORMAL HIGH (ref 4.0–10.5)

## 2015-11-06 LAB — BASIC METABOLIC PANEL
ANION GAP: 5 (ref 5–15)
BUN: 27 mg/dL — ABNORMAL HIGH (ref 6–20)
CALCIUM: 8.4 mg/dL — AB (ref 8.9–10.3)
CO2: 24 mmol/L (ref 22–32)
CREATININE: 0.61 mg/dL (ref 0.44–1.00)
Chloride: 100 mmol/L — ABNORMAL LOW (ref 101–111)
Glucose, Bld: 87 mg/dL (ref 65–99)
Potassium: 5.4 mmol/L — ABNORMAL HIGH (ref 3.5–5.1)
Sodium: 129 mmol/L — ABNORMAL LOW (ref 135–145)

## 2015-11-06 LAB — AMMONIA: AMMONIA: 56 umol/L — AB (ref 9–35)

## 2015-11-06 LAB — ABO/RH: ABO/RH(D): O POS

## 2015-11-06 LAB — MAGNESIUM: Magnesium: 1.7 mg/dL (ref 1.7–2.4)

## 2015-11-07 LAB — BASIC METABOLIC PANEL
ANION GAP: 6 (ref 5–15)
BUN: 29 mg/dL — ABNORMAL HIGH (ref 6–20)
CHLORIDE: 101 mmol/L (ref 101–111)
CO2: 22 mmol/L (ref 22–32)
CREATININE: 0.96 mg/dL (ref 0.44–1.00)
Calcium: 8.3 mg/dL — ABNORMAL LOW (ref 8.9–10.3)
GFR calc non Af Amer: >60 mL/min (ref >60)
Glucose, Bld: 141 mg/dL — ABNORMAL HIGH (ref 65–99)
POTASSIUM: 5 mmol/L (ref 3.5–5.1)
SODIUM: 129 mmol/L — AB (ref 135–145)

## 2015-11-07 LAB — CBC
HEMATOCRIT: 23.4 % — AB (ref 36.0–46.0)
Hemoglobin: 7.9 g/dL — ABNORMAL LOW (ref 12.0–15.0)
MCH: 26.3 pg (ref 26.0–34.0)
MCHC: 33.8 g/dL (ref 30.0–36.0)
MCV: 78 fL (ref 78.0–100.0)
Platelets: 317 10*3/uL (ref 150–400)
RBC: 3 MIL/uL — ABNORMAL LOW (ref 3.87–5.11)
RDW: 21.9 % — ABNORMAL HIGH (ref 11.5–15.5)
WBC: 11.4 10*3/uL — AB (ref 4.0–10.5)

## 2015-11-07 LAB — MAGNESIUM: MAGNESIUM: 1.9 mg/dL (ref 1.7–2.4)

## 2015-11-10 LAB — TYPE AND SCREEN
ABO/RH(D): O POS
Antibody Screen: NEGATIVE
Unit division: 0

## 2015-11-11 LAB — CBC
HCT: 23.1 % — ABNORMAL LOW (ref 36.0–46.0)
HEMOGLOBIN: 7.6 g/dL — AB (ref 12.0–15.0)
MCH: 24.9 pg — AB (ref 26.0–34.0)
MCHC: 32.9 g/dL (ref 30.0–36.0)
MCV: 75.7 fL — AB (ref 78.0–100.0)
PLATELETS: 413 10*3/uL — AB (ref 150–400)
RBC: 3.05 MIL/uL — ABNORMAL LOW (ref 3.87–5.11)
RDW: 19.4 % — ABNORMAL HIGH (ref 11.5–15.5)
WBC: 13.4 10*3/uL — ABNORMAL HIGH (ref 4.0–10.5)

## 2015-11-11 LAB — MAGNESIUM: MAGNESIUM: 1.8 mg/dL (ref 1.7–2.4)

## 2015-11-11 LAB — BASIC METABOLIC PANEL
Anion gap: 3 — ABNORMAL LOW (ref 5–15)
BUN: 44 mg/dL — ABNORMAL HIGH (ref 6–20)
CALCIUM: 7.9 mg/dL — AB (ref 8.9–10.3)
CHLORIDE: 100 mmol/L — AB (ref 101–111)
CO2: 20 mmol/L — AB (ref 22–32)
CREATININE: 2.01 mg/dL — AB (ref 0.44–1.00)
GFR calc Af Amer: 31 mL/min — ABNORMAL LOW (ref >60)
GFR calc non Af Amer: 27 mL/min — ABNORMAL LOW (ref >60)
GLUCOSE: 108 mg/dL — AB (ref 65–99)
Potassium: 6.1 mmol/L — ABNORMAL HIGH (ref 3.5–5.1)
Sodium: 123 mmol/L — ABNORMAL LOW (ref 135–145)

## 2015-11-11 LAB — PHOSPHORUS: Phosphorus: 6.1 mg/dL — ABNORMAL HIGH (ref 2.5–4.6)

## 2015-11-12 LAB — POTASSIUM
POTASSIUM: 6 mmol/L — AB (ref 3.5–5.1)
POTASSIUM: 5.6 mmol/L — AB (ref 3.5–5.1)

## 2015-11-13 ENCOUNTER — Other Ambulatory Visit (HOSPITAL_COMMUNITY): Payer: Medicare Other

## 2015-11-13 LAB — TROPONIN I
TROPONIN I: 0.04 ng/mL — AB (ref <0.03)
Troponin I: 0.03 ng/mL (ref <0.03)

## 2015-11-13 LAB — BLOOD GAS, ARTERIAL
Acid-base deficit: 21.2 mmol/L — ABNORMAL HIGH (ref 0.0–2.0)
Bicarbonate: 10 mEq/L — ABNORMAL LOW (ref 20.0–24.0)
FIO2: 1
MECHVT: 400 mL
O2 Saturation: 98.5 %
PEEP: 5 cmH2O
Patient temperature: 98.1
RATE: 14 resp/min
TCO2: 12 mmol/L (ref 0–100)
pCO2 arterial: 62.7 mmHg (ref 35.0–45.0)
pH, Arterial: 6.834 — CL (ref 7.350–7.450)
pO2, Arterial: 359 mmHg — ABNORMAL HIGH (ref 80.0–100.0)

## 2015-11-13 LAB — COMPREHENSIVE METABOLIC PANEL
ALT: 20 U/L (ref 14–54)
ALT: 35 U/L (ref 14–54)
ANION GAP: 13 (ref 5–15)
AST: 73 U/L — ABNORMAL HIGH (ref 15–41)
AST: 160 U/L — AB (ref 15–41)
Albumin: 1.9 g/dL — ABNORMAL LOW (ref 3.5–5.0)
Albumin: 1.7 g/dL — ABNORMAL LOW (ref 3.5–5.0)
Alkaline Phosphatase: 243 U/L — ABNORMAL HIGH (ref 38–126)
Alkaline Phosphatase: 215 U/L — ABNORMAL HIGH (ref 38–126)
Anion gap: 16 — ABNORMAL HIGH (ref 5–15)
BILIRUBIN TOTAL: 1.5 mg/dL — AB (ref 0.3–1.2)
BUN: 48 mg/dL — ABNORMAL HIGH (ref 6–20)
BUN: 47 mg/dL — AB (ref 6–20)
CO2: 11 mmol/L — ABNORMAL LOW (ref 22–32)
CO2: 15 mmol/L — ABNORMAL LOW (ref 22–32)
Calcium: 8.5 mg/dL — ABNORMAL LOW (ref 8.9–10.3)
Calcium: 8.8 mg/dL — ABNORMAL LOW (ref 8.9–10.3)
Chloride: 94 mmol/L — ABNORMAL LOW (ref 101–111)
Chloride: 94 mmol/L — ABNORMAL LOW (ref 101–111)
Creatinine, Ser: 3.54 mg/dL — ABNORMAL HIGH (ref 0.44–1.00)
Creatinine, Ser: 3.5 mg/dL — ABNORMAL HIGH (ref 0.44–1.00)
GFR calc Af Amer: 16 mL/min — ABNORMAL LOW (ref >60)
GFR calc non Af Amer: 13 mL/min — ABNORMAL LOW (ref >60)
GFR, EST AFRICAN AMERICAN: 16 mL/min — AB (ref >60)
GFR, EST NON AFRICAN AMERICAN: 14 mL/min — AB (ref >60)
Glucose, Bld: 107 mg/dL — ABNORMAL HIGH (ref 65–99)
Glucose, Bld: 92 mg/dL (ref 65–99)
POTASSIUM: 5.7 mmol/L — AB (ref 3.5–5.1)
Potassium: 6.1 mmol/L — ABNORMAL HIGH (ref 3.5–5.1)
Sodium: 121 mmol/L — ABNORMAL LOW (ref 135–145)
Sodium: 122 mmol/L — ABNORMAL LOW (ref 135–145)
TOTAL PROTEIN: 5.7 g/dL — AB (ref 6.5–8.1)
Total Bilirubin: 1.6 mg/dL — ABNORMAL HIGH (ref 0.3–1.2)
Total Protein: 6.1 g/dL — ABNORMAL LOW (ref 6.5–8.1)

## 2015-11-13 LAB — POCT I-STAT 3, VENOUS BLOOD GAS (G3P V)
Acid-base deficit: 23 mmol/L — ABNORMAL HIGH (ref 0.0–2.0)
Bicarbonate: 10.5 mEq/L — ABNORMAL LOW (ref 20.0–24.0)
O2 Saturation: 55 %
PH VEN: 6.838 — AB (ref 7.250–7.300)
TCO2: 12 mmol/L (ref 0–100)
pCO2, Ven: 62 mmHg — ABNORMAL HIGH (ref 45.0–50.0)
pO2, Ven: 52 mmHg — ABNORMAL HIGH (ref 31.0–45.0)

## 2015-11-13 LAB — CBC
HCT: 25.1 % — ABNORMAL LOW (ref 36.0–46.0)
HCT: 24.8 % — ABNORMAL LOW (ref 36.0–46.0)
Hemoglobin: 8.1 g/dL — ABNORMAL LOW (ref 12.0–15.0)
Hemoglobin: 7.9 g/dL — ABNORMAL LOW (ref 12.0–15.0)
MCH: 25.6 pg — ABNORMAL LOW (ref 26.0–34.0)
MCH: 25.2 pg — ABNORMAL LOW (ref 26.0–34.0)
MCHC: 32.3 g/dL (ref 30.0–36.0)
MCHC: 31.9 g/dL (ref 30.0–36.0)
MCV: 79.2 fL (ref 78.0–100.0)
MCV: 79 fL (ref 78.0–100.0)
Platelets: 381 10*3/uL (ref 150–400)
Platelets: 331 10*3/uL (ref 150–400)
RBC: 3.17 MIL/uL — ABNORMAL LOW (ref 3.87–5.11)
RBC: 3.14 MIL/uL — ABNORMAL LOW (ref 3.87–5.11)
RDW: 19.4 % — ABNORMAL HIGH (ref 11.5–15.5)
RDW: 19.3 % — ABNORMAL HIGH (ref 11.5–15.5)
WBC: 27.7 10*3/uL — ABNORMAL HIGH (ref 4.0–10.5)
WBC: 22.4 10*3/uL — ABNORMAL HIGH (ref 4.0–10.5)

## 2015-11-13 LAB — MAGNESIUM: Magnesium: 2.1 mg/dL (ref 1.7–2.4)

## 2015-11-13 LAB — LACTIC ACID, PLASMA
Lactic Acid, Venous: 10.5 mmol/L (ref 0.5–1.9)
Lactic Acid, Venous: 7.6 mmol/L (ref 0.5–1.9)

## 2015-11-13 LAB — PHOSPHORUS: Phosphorus: 12.3 mg/dL — ABNORMAL HIGH (ref 2.5–4.6)

## 2015-11-13 LAB — POTASSIUM: Potassium: 5.7 mmol/L — ABNORMAL HIGH (ref 3.5–5.1)

## 2015-11-13 MED FILL — Medication: Qty: 1 | Status: AC

## 2015-11-20 NOTE — Code Documentation (Signed)
CODE BLUE NOTE  Patient Name: Marie Brooks   MRN: [id number]   Date of Birth/ Sex: 07/08/1960 , female      Admission Date: 11/10/2015  Attending Provider: Carron Curie, MD  Primary Diagnosis: <principal problem not specified>    Indication: Pt was in her usual state of health until this AM, when she was noted to be breathing agonally and without a pulse. Initial rhythm is uncertain. Code blue was subsequently called. At the time of arrival on scene, ACLS protocol was underway. The patient received epinepherine x1 and one round of CPR and a pulse was recovered.      Technical Description:  - CPR performance duration:  One round   - Was defibrillation or cardioversion used? No   - Was external pacer placed? No  - Was patient intubated pre/post CPR? Yes    Medications Administered: Y = Yes; Blank = No Amiodarone    Atropine    Calcium    Epinephrine  x1  Lidocaine    Magnesium    Norepinephrine    Phenylephrine    Sodium bicarbonate    Vasopressin      Post CPR evaluation:  - Final Status - Was patient successfully resuscitated ? Yes - What is current rhythm? NSR - What is current hemodynamic status? Hypotension, requiring fluids and levophed   Miscellaneous Information:  - Labs sent, including: Mg, phos, lactate, CBC, CMP, troponin, CXR, abd XR, EKG  - Primary team notified?  No  - Family Notified? Uncertain  - Additional notes/ transfer status: Care transferred to [facility] physician         Renne Musca, MD  12/01/2015, 3:43 AM

## 2015-11-20 NOTE — Code Documentation (Signed)
CODE BLUE NOTE  Patient Name: Marie Brooks   MRN: [id number]   Date of Birth/ Sex: 11/04/1960 , female      Admission Date: 11/18/2015  Attending Provider: Carron Curie, MD  Primary Diagnosis: hernia repair    Indication: Pt was in her usual state of health until this AM around 330AM when she coded and was revived successfully and was stable for a time. Around 645AM  she was noted to be agonally breathing and was in PEA. Code blue was subsequently called. At the time of arrival on scene, ACLS protocol was underway.    Technical Description:  - CPR performance duration:  27 minute  - Was defibrillation or cardioversion used? Yes   - Was external pacer placed? No  - Was patient intubated pre/post CPR? Yes    Medications Administered: Y = Yes; Blank = No Amiodarone  x1  Atropine    Calcium  x2  Epinephrine  x6  Lidocaine    Magnesium    Norepinephrine    Phenylephrine    Sodium bicarbonate  x4  Vasopressin    Other     Post CPR evaluation:  - Final Status - Was patient successfully resuscitated ? No   Miscellaneous Information:  - Time of death:  712 AM  - Primary team notified?  Unclear  - Family Notified? Unclear        Renne Musca, MD   10/31/2015, 7:19 AM

## 2015-11-20 NOTE — Procedures (Signed)
Intubation Procedure Note Marie Brooks [id number] 07-25-1960  Procedure: Intubation Indications: Respiratory insufficiency  Procedure Details Consent: Unable to obtain consent because of emergent medical necessity. Time Out: Verified patient identification, verified procedure, site/side was marked, verified correct patient position, special equipment/implants available, medications/allergies/relevent history reviewed, required imaging and test results available.  Performed  Maximum sterile technique was used including gloves, gown and hand hygiene.  MAC and 3    Evaluation Hemodynamic Status: Transient hypotension treated with pressors and fluid; O2 sats: currently acceptable Patient's Current Condition: stable Complications: No apparent complications Patient did tolerate procedure well. Chest X-ray ordered to verify placement.  CXR: pending.  Pt was Intubated per RT, emergent necessary.  MD at bedside Benjamine Sprague, BS, RCP, RRT 11/19/2015

## 2015-11-20 DEATH — deceased

## 2016-08-12 IMAGING — CT CT ABD-PELV W/O CM
2 of 4 series · 17 of 46 positions shown, 19 images · non-contrast
Comparison: CT scan of October 24, 2013.

CLINICAL DATA: Umbilical hernia.

EXAM:
CT ABDOMEN AND PELVIS WITHOUT CONTRAST
TECHNIQUE: Multidetector CT imaging of the abdomen and pelvis was performed
following the standard protocol without IV contrast.

[Series 2: rtn a/p w/o · axial · non-contrast · 0.81mm/px · z∈[-450,-50]mm · 14 of 88 slices shown, 16 images]
[im 4/88  soft-tissue]
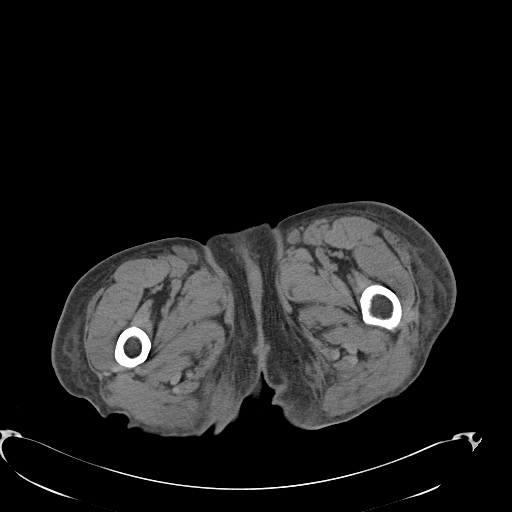
[im 4/88  bone]
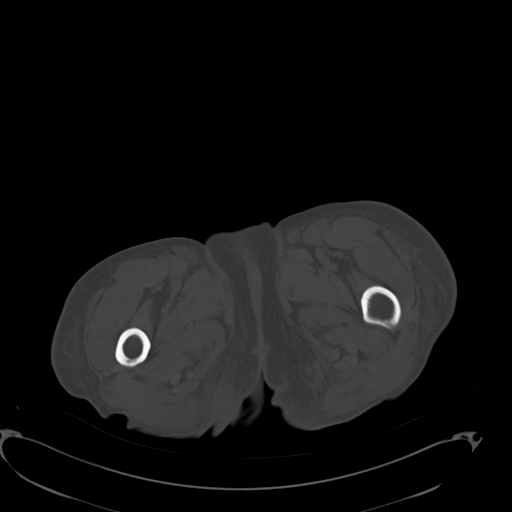
[im 11/88  soft-tissue]
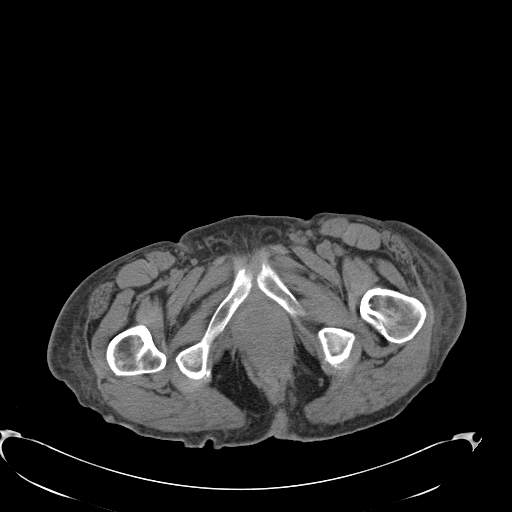
[im 18/88  soft-tissue]
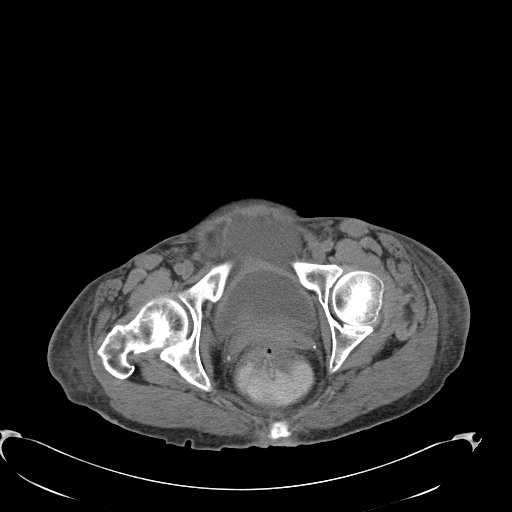
[im 25/88  soft-tissue]
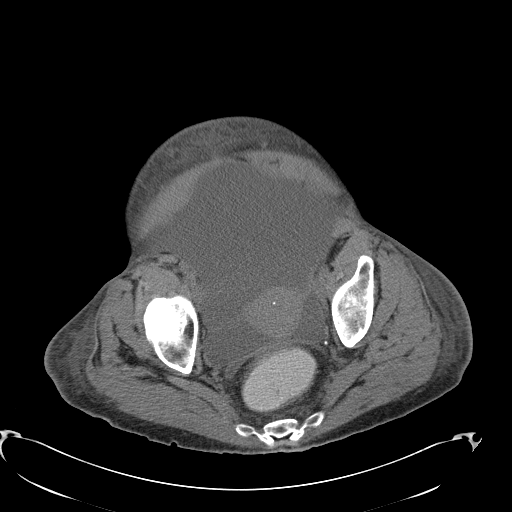
[im 28/88  soft-tissue]
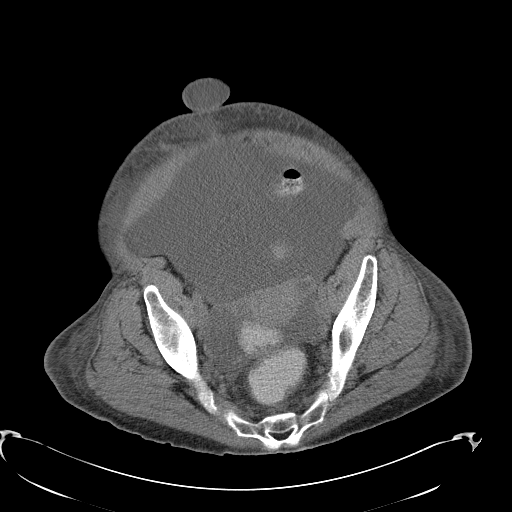
[im 35/88  soft-tissue]
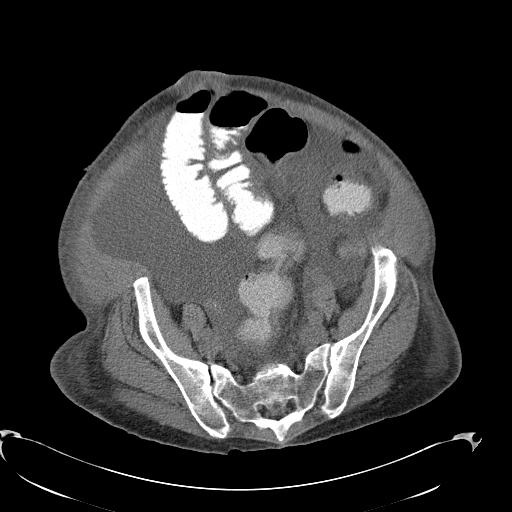
[im 42/88  soft-tissue]
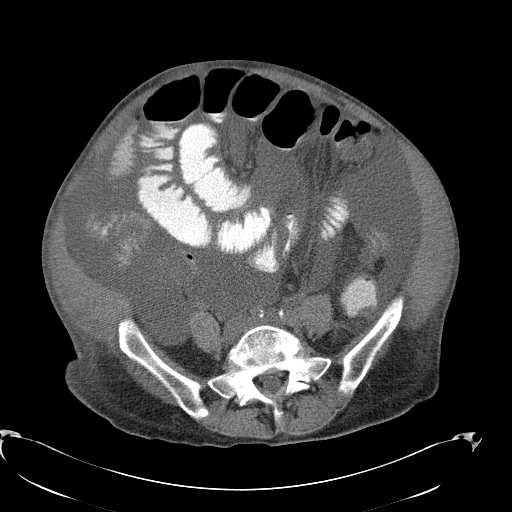
[im 46/88  soft-tissue]
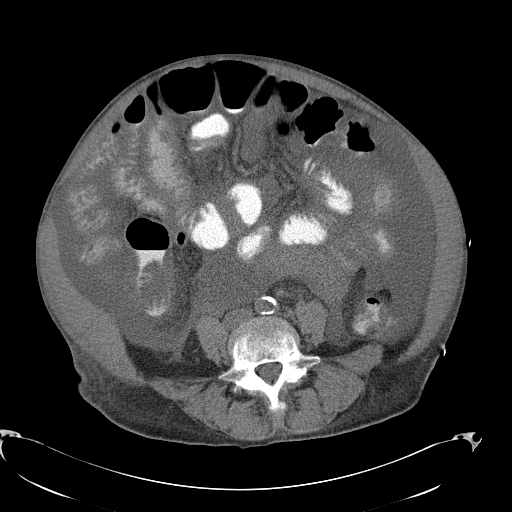
[im 53/88  soft-tissue]
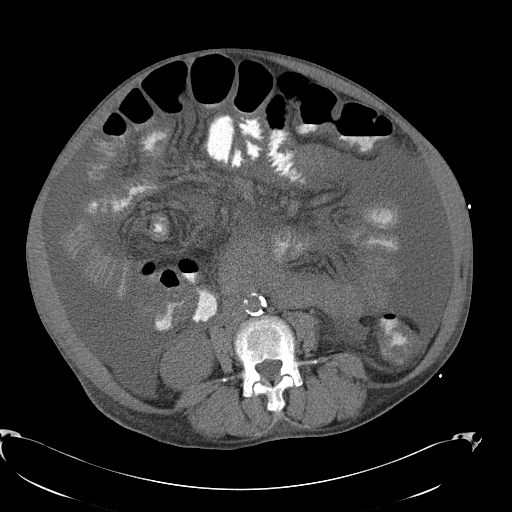
[im 53/88  bone]
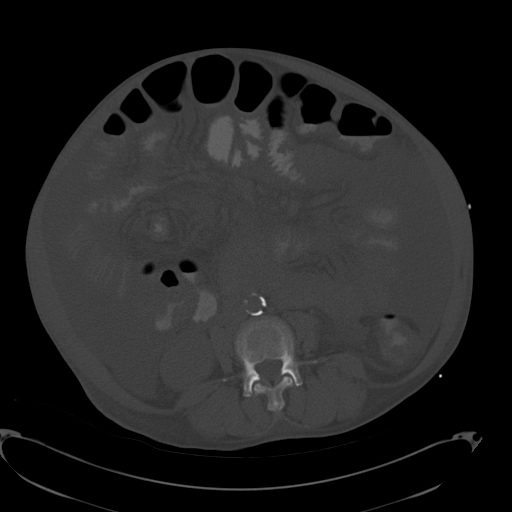
[im 60/88  soft-tissue]
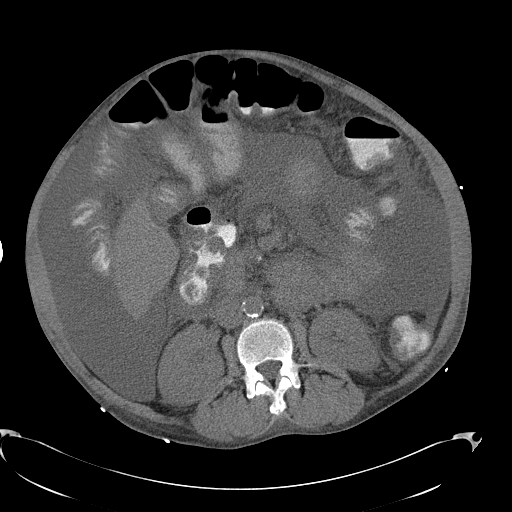
[im 67/88  soft-tissue]
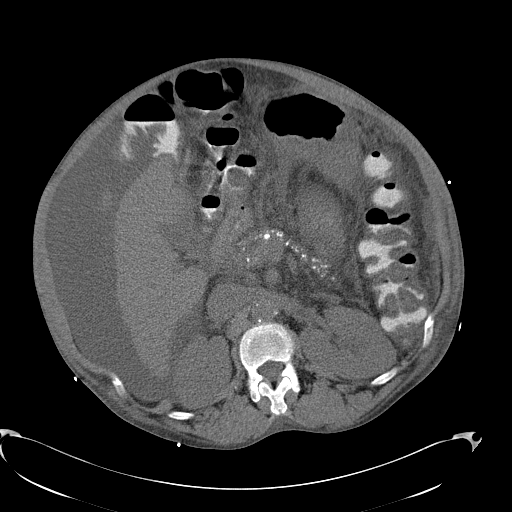
[im 70/88  soft-tissue]
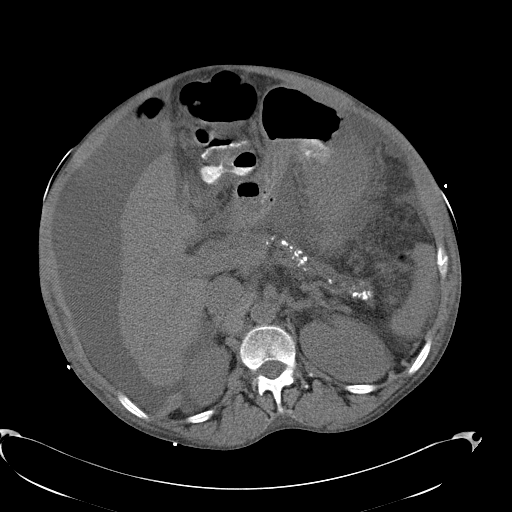
[im 77/88  soft-tissue]
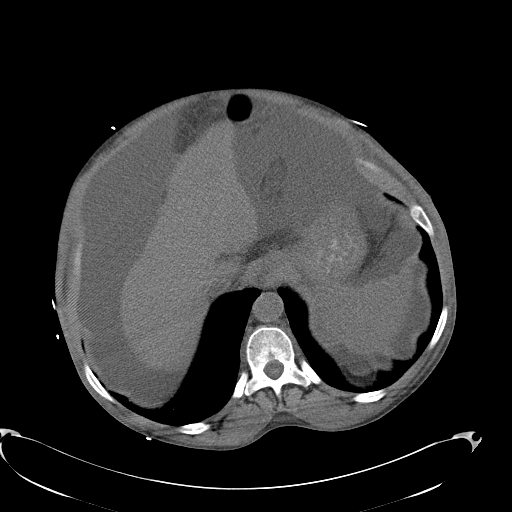
[im 84/88  soft-tissue]
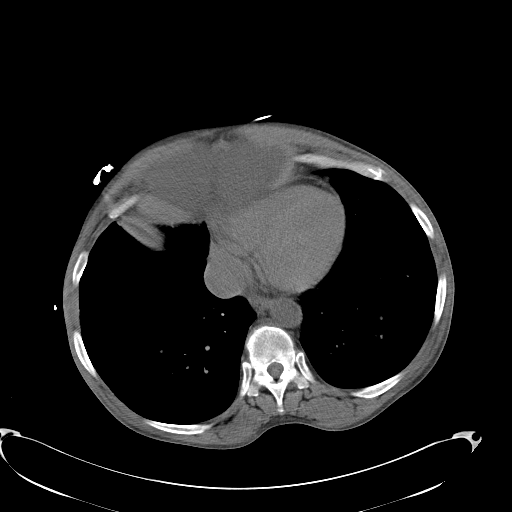

[Series 602: <mpr thick range> · coronal · 0.85mm/px · 3 of 114 slices shown]
[im 38/114  soft-tissue]
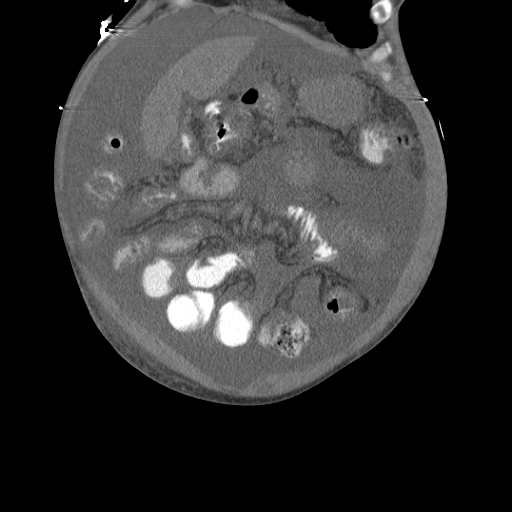
[im 51/114  soft-tissue]
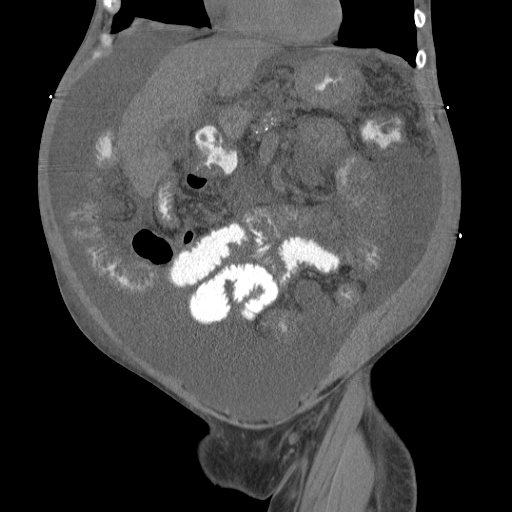
[im 63/114  soft-tissue]
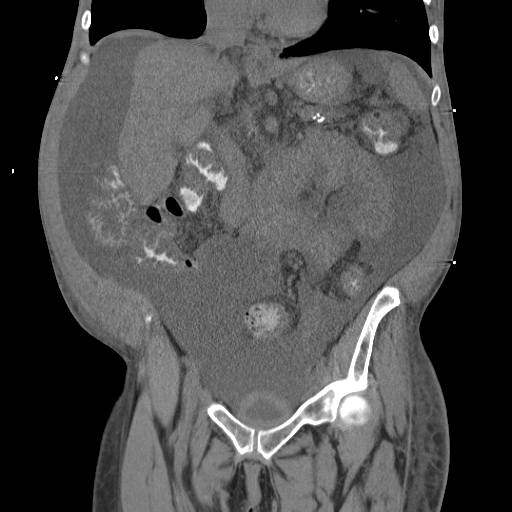

[17 of 46 positions shown; findings below may reference images not displayed]

FINDINGS: Moderate degenerative disc disease is noted at L5-S1. Visualized
lung bases appear normal.

Small solitary gallstone is noted. No focal abnormality seen in the
liver or spleen on these unenhanced images. Pancreatic
calcifications are noted consistent with chronic pancreatitis.
Adrenal glands and kidneys appear normal. No hydronephrosis or renal
obstruction is noted. Moderate ascites is noted. No colonic
dilatation is noted. The appendix appears normal. Mildly dilated
small bowel loops are noted centrally within the abdomen most
consistent with ileus. No definite transition zone is noted. Urinary
bladder appears normal. Uterus and ovaries are unremarkable. Small
fat containing periumbilical hernia is noted. There is no evidence
of bowel incarceration. Mild anasarca is noted. No significant
adenopathy is noted. Atherosclerosis of abdominal aorta is noted
without aneurysm formation.
IMPRESSION: Moderate ascites is noted.

Atherosclerosis of abdominal aorta without aneurysm formation.

Pancreatic calcifications consistent with chronic pancreatitis.

Small solitary gallstone.

Mildly dilated small bowel loops are noted centrally in the abdomen
most consistent with ileus. Continued radiographic follow-up is
recommended.

Mild anasarca is noted.

## 2016-08-12 IMAGING — CR DG CHEST 1V PORT
1 series · 1 of 1 positions shown · non-contrast
Comparison: Chest x-rays dated 12/14/2014 and 09/06/2014.

CLINICAL DATA: Hypoxia

EXAM:
PORTABLE CHEST - 1 VIEW

[AP]
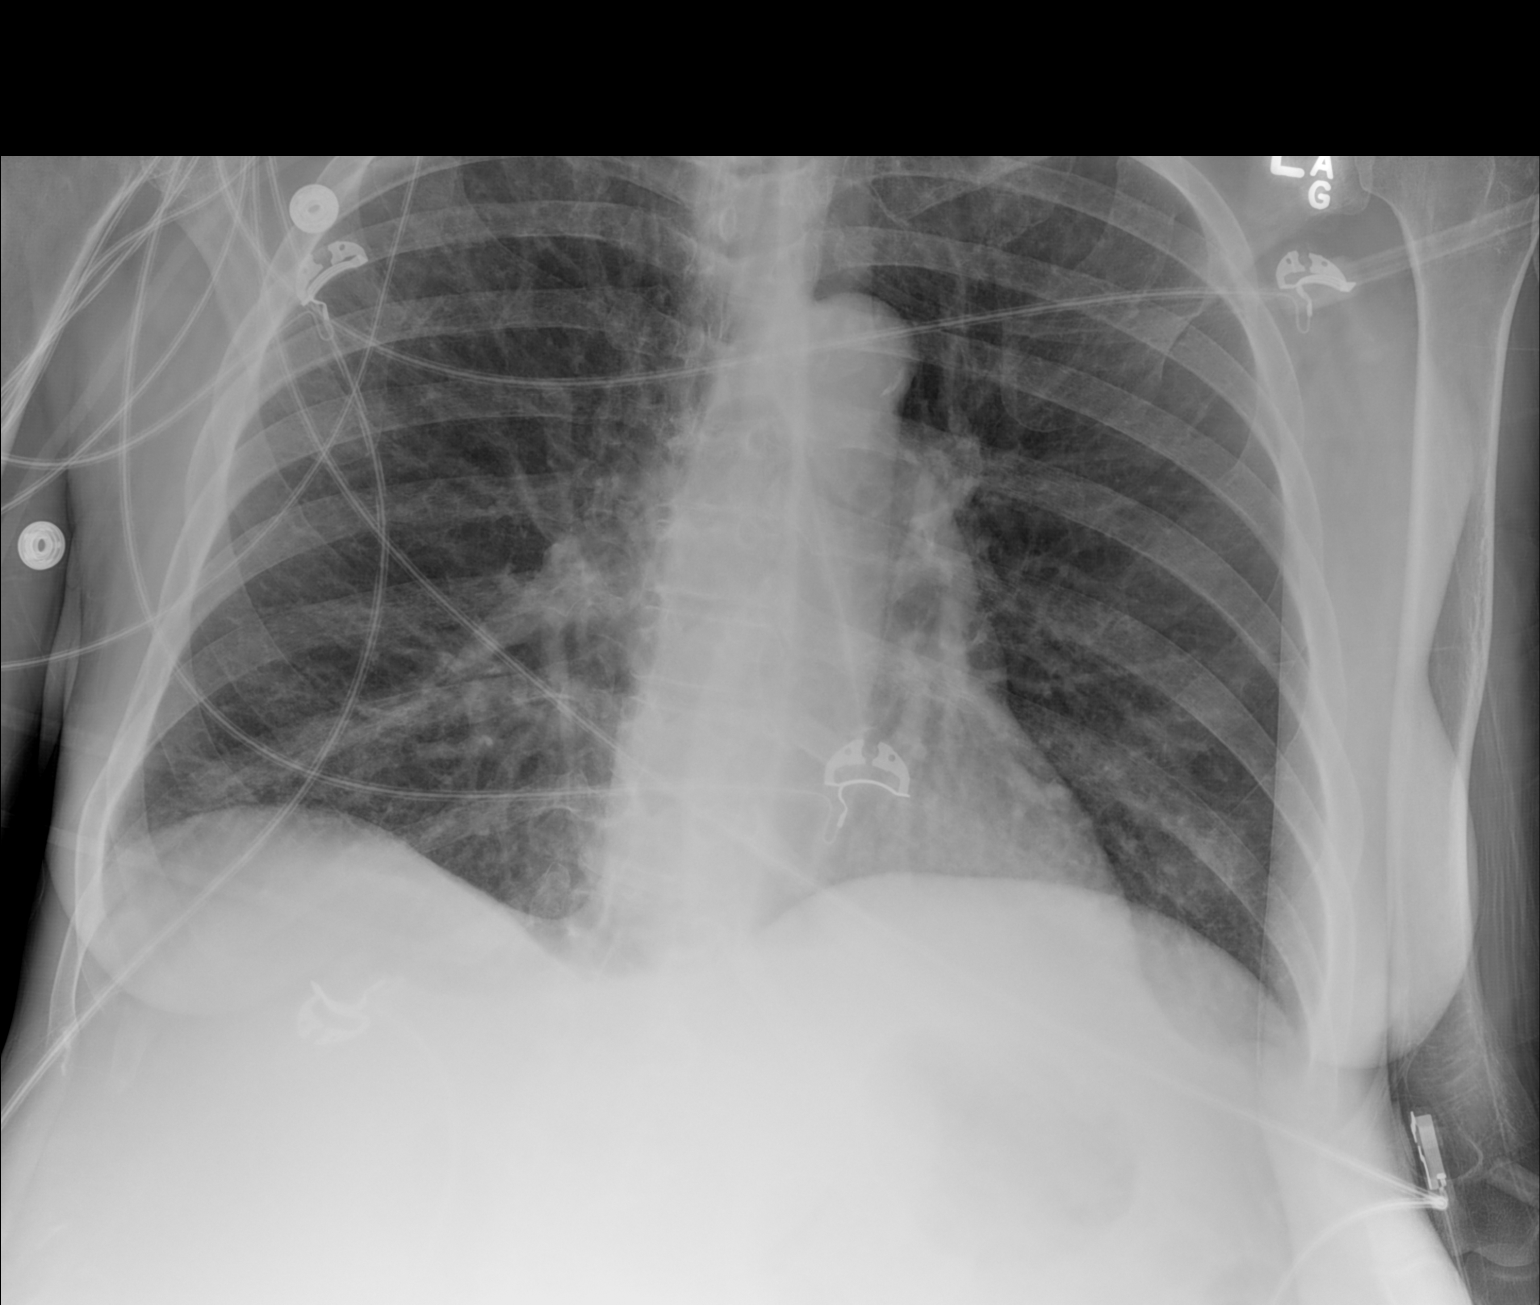

[1 of 1 positions shown; findings below may reference images not displayed]

FINDINGS: Cardiomediastinal silhouette remains normal in size and
configuration. Suspect minimal subsegmental atelectasis at each lung
base. There is mild interstitial prominence bilaterally which is
without significant change compared to multiple prior studies. Lungs
otherwise clear. No confluent airspace opacity to suggest a
developing pneumonia. No pleural effusion seen. No pneumothorax.
Lung volumes are normal. No osseous abnormality seen.
IMPRESSION: Probable mild subsegmental atelectasis at each lung base.

Mild interstitial prominence bilaterally which I suspect is chronic
but could indicate a mild interstitial edema.

Otherwise unremarkable chest x-ray.  No evidence of pneumonia.
